# Patient Record
Sex: Female | Born: 1980 | Race: White | Hispanic: No | Marital: Married | Smoking: Never smoker
Health system: Southern US, Community
[De-identification: ages and names within clinical notes are randomized; demographics above are authoritative.]

## PROBLEM LIST (undated history)

## (undated) DIAGNOSIS — Z8489 Family history of other specified conditions: Secondary | ICD-10-CM

## (undated) DIAGNOSIS — J189 Pneumonia, unspecified organism: Secondary | ICD-10-CM

## (undated) DIAGNOSIS — F988 Other specified behavioral and emotional disorders with onset usually occurring in childhood and adolescence: Secondary | ICD-10-CM

## (undated) DIAGNOSIS — R51 Headache: Secondary | ICD-10-CM

## (undated) HISTORY — PX: WISDOM TOOTH EXTRACTION: SHX21

## (undated) HISTORY — PX: KNEE ARTHROSCOPY: SUR90

## (undated) HISTORY — PX: CERVICAL ABLATION: SHX5771

---
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
## 2011-01-25 ENCOUNTER — Other Ambulatory Visit (HOSPITAL_COMMUNITY)
Admission: RE | Admit: 2011-01-25 | Discharge: 2011-01-25 | Disposition: A | Discharge status: Home / Self Care | Payer: 59 | Source: Ambulatory Visit | Attending: Obstetrics & Gynecology | Admitting: Obstetrics & Gynecology

## 2011-01-25 DIAGNOSIS — Z124 Encounter for screening for malignant neoplasm of cervix: Secondary | ICD-10-CM | POA: Insufficient documentation

## 2011-01-25 DIAGNOSIS — R87810 Cervical high risk human papillomavirus (HPV) DNA test positive: Secondary | ICD-10-CM | POA: Insufficient documentation

## 2011-02-25 ENCOUNTER — Other Ambulatory Visit: Payer: Self-pay | Admitting: Obstetrics and Gynecology

## 2011-02-25 DIAGNOSIS — N63 Unspecified lump in unspecified breast: Secondary | ICD-10-CM

## 2011-03-01 ENCOUNTER — Inpatient Hospital Stay: Admission: RE | Admit: 2011-03-01 | Payer: 59 | Source: Ambulatory Visit

## 2011-03-03 ENCOUNTER — Ambulatory Visit
Admission: RE | Admit: 2011-03-03 | Discharge: 2011-03-03 | Disposition: A | Discharge status: Home / Self Care | Payer: 59 | Source: Ambulatory Visit | Attending: Obstetrics and Gynecology | Admitting: Obstetrics and Gynecology

## 2011-03-03 DIAGNOSIS — N63 Unspecified lump in unspecified breast: Secondary | ICD-10-CM

## 2011-08-16 ENCOUNTER — Other Ambulatory Visit (HOSPITAL_COMMUNITY)
Admission: RE | Admit: 2011-08-16 | Discharge: 2011-08-16 | Disposition: A | Discharge status: Home / Self Care | Payer: 59 | Source: Ambulatory Visit | Attending: Obstetrics and Gynecology | Admitting: Obstetrics and Gynecology

## 2011-08-16 DIAGNOSIS — Z124 Encounter for screening for malignant neoplasm of cervix: Secondary | ICD-10-CM | POA: Insufficient documentation

## 2011-12-20 ENCOUNTER — Other Ambulatory Visit (HOSPITAL_COMMUNITY)
Admission: RE | Admit: 2011-12-20 | Discharge: 2011-12-20 | Disposition: A | Discharge status: Home / Self Care | Payer: 59 | Source: Ambulatory Visit | Attending: Obstetrics and Gynecology | Admitting: Obstetrics and Gynecology

## 2011-12-20 DIAGNOSIS — Z1159 Encounter for screening for other viral diseases: Secondary | ICD-10-CM | POA: Insufficient documentation

## 2011-12-20 DIAGNOSIS — Z124 Encounter for screening for malignant neoplasm of cervix: Secondary | ICD-10-CM | POA: Insufficient documentation

## 2012-04-24 ENCOUNTER — Other Ambulatory Visit (HOSPITAL_COMMUNITY)
Admission: RE | Admit: 2012-04-24 | Discharge: 2012-04-24 | Disposition: A | Discharge status: Home / Self Care | Payer: 59 | Source: Ambulatory Visit | Attending: Obstetrics and Gynecology | Admitting: Obstetrics and Gynecology

## 2012-04-24 DIAGNOSIS — Z124 Encounter for screening for malignant neoplasm of cervix: Secondary | ICD-10-CM | POA: Insufficient documentation

## 2013-06-06 IMAGING — US US BREAST RIGHT
1 series · 2 of 2 positions shown · non-contrast
Comparison: None.

On physical exam, no mass is palpated in the upper half of the
right breast.

CLINICAL DATA: The patient's physician felt a lump at 12 o'clock
in the right breast.  A

RIGHT BREAST ULTRASOUND

[Series 1: us breast right · 2 of 2 slices shown]
[im 1/2]
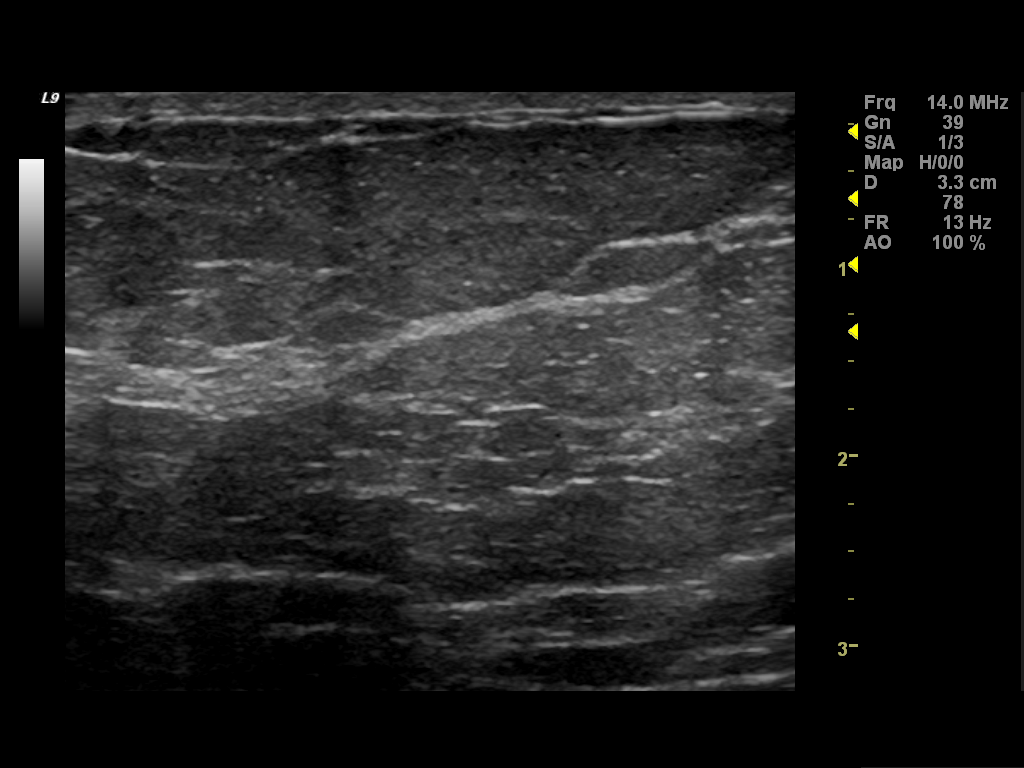
[im 2/2]
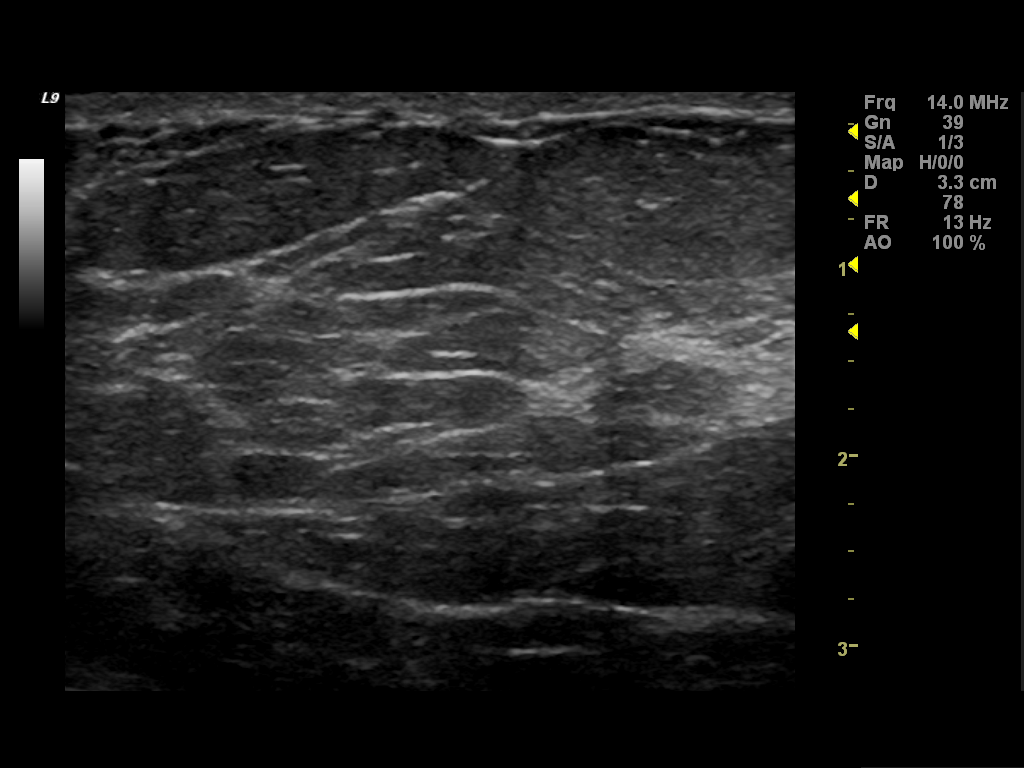

[2 of 2 positions shown; findings below may reference images not displayed]

FINDINGS: Ultrasound is performed, showing fatty tissue throughout
the upper half of the right breast with no mass, distortion or
shadowing to suggest malignancy.
IMPRESSION: No sonographic evidence of malignancy.  Yearly screening
mammography should begin at age 40 unless clinically indicated
earlier.

BI-RADS CATEGORY 1:  Negative.

## 2013-12-09 ENCOUNTER — Emergency Department (HOSPITAL_BASED_OUTPATIENT_CLINIC_OR_DEPARTMENT_OTHER)
Admission: EM | Admit: 2013-12-09 | Discharge: 2013-12-09 | Disposition: A | Discharge status: Home / Self Care | Payer: 59 | Attending: Emergency Medicine | Admitting: Emergency Medicine

## 2013-12-09 ENCOUNTER — Emergency Department (HOSPITAL_BASED_OUTPATIENT_CLINIC_OR_DEPARTMENT_OTHER): Payer: 59

## 2013-12-09 ENCOUNTER — Encounter (HOSPITAL_BASED_OUTPATIENT_CLINIC_OR_DEPARTMENT_OTHER): Payer: Self-pay | Admitting: Emergency Medicine

## 2013-12-09 DIAGNOSIS — Z79899 Other long term (current) drug therapy: Secondary | ICD-10-CM | POA: Insufficient documentation

## 2013-12-09 DIAGNOSIS — E663 Overweight: Secondary | ICD-10-CM | POA: Insufficient documentation

## 2013-12-09 DIAGNOSIS — K8020 Calculus of gallbladder without cholecystitis without obstruction: Secondary | ICD-10-CM

## 2013-12-09 DIAGNOSIS — Z3202 Encounter for pregnancy test, result negative: Secondary | ICD-10-CM | POA: Insufficient documentation

## 2013-12-09 LAB — CBC WITH DIFFERENTIAL/PLATELET
BASOS ABS: 0 10*3/uL (ref 0.0–0.1)
Basophils Relative: 0 % (ref 0–1)
Eosinophils Absolute: 0 10*3/uL (ref 0.0–0.7)
Eosinophils Relative: 0 % (ref 0–5)
HCT: 43.3 % (ref 36.0–46.0)
Hemoglobin: 14.6 g/dL (ref 12.0–15.0)
Lymphocytes Relative: 22 % (ref 12–46)
Lymphs Abs: 2 10*3/uL (ref 0.7–4.0)
MCH: 29.4 pg (ref 26.0–34.0)
MCHC: 33.7 g/dL (ref 30.0–36.0)
MCV: 87.3 fL (ref 78.0–100.0)
Monocytes Absolute: 0.6 10*3/uL (ref 0.1–1.0)
Monocytes Relative: 7 % (ref 3–12)
NEUTROS ABS: 6.5 10*3/uL (ref 1.7–7.7)
Neutrophils Relative %: 71 % (ref 43–77)
Platelets: 144 10*3/uL — ABNORMAL LOW (ref 150–400)
RBC: 4.96 MIL/uL (ref 3.87–5.11)
RDW: 12.1 % (ref 11.5–15.5)
WBC: 9.1 10*3/uL (ref 4.0–10.5)

## 2013-12-09 LAB — COMPREHENSIVE METABOLIC PANEL
ALT: 29 U/L (ref 0–35)
AST: 20 U/L (ref 0–37)
Albumin: 4.3 g/dL (ref 3.5–5.2)
Alkaline Phosphatase: 59 U/L (ref 39–117)
BILIRUBIN TOTAL: 1 mg/dL (ref 0.3–1.2)
BUN: 6 mg/dL (ref 6–23)
CHLORIDE: 104 meq/L (ref 96–112)
CO2: 23 mEq/L (ref 19–32)
Calcium: 9.7 mg/dL (ref 8.4–10.5)
Creatinine, Ser: 0.6 mg/dL (ref 0.50–1.10)
GFR calc Af Amer: >90 mL/min (ref >90)
Glucose, Bld: 92 mg/dL (ref 70–99)
POTASSIUM: 3.8 meq/L (ref 3.7–5.3)
Sodium: 141 mEq/L (ref 137–147)
Total Protein: 7.8 g/dL (ref 6.0–8.3)

## 2013-12-09 LAB — URINALYSIS, ROUTINE W REFLEX MICROSCOPIC
BILIRUBIN URINE: NEGATIVE
Glucose, UA: NEGATIVE mg/dL
HGB URINE DIPSTICK: NEGATIVE
Ketones, ur: NEGATIVE mg/dL
Leukocytes, UA: NEGATIVE
Nitrite: NEGATIVE
PH: 7.5 (ref 5.0–8.0)
Protein, ur: NEGATIVE mg/dL
SPECIFIC GRAVITY, URINE: 1.013 (ref 1.005–1.030)
Urobilinogen, UA: 0.2 mg/dL (ref 0.0–1.0)

## 2013-12-09 LAB — LIPASE, BLOOD: Lipase: 22 U/L (ref 11–59)

## 2013-12-09 LAB — PREGNANCY, URINE: Preg Test, Ur: NEGATIVE

## 2013-12-09 MED ORDER — SODIUM CHLORIDE 0.9 % IV SOLN
1000.0000 mL | INTRAVENOUS | Status: DC
  Administered 2013-12-09: 1000 mL via INTRAVENOUS

## 2013-12-09 MED ORDER — HYDROMORPHONE HCL PF 1 MG/ML IJ SOLN: 0.5000 mg | INTRAMUSCULAR | Status: DC | PRN

## 2013-12-09 MED ORDER — ONDANSETRON HCL 4 MG PO TABS: 4.0000 mg | ORAL_TABLET | Freq: Four times a day (QID) | ORAL | Status: DC

## 2013-12-09 MED ORDER — HYDROCODONE-ACETAMINOPHEN 5-325 MG PO TABS: 1.0000 | ORAL_TABLET | ORAL | Status: DC | PRN

## 2013-12-09 MED ORDER — SODIUM CHLORIDE 0.9 % IV SOLN
1000.0000 mL | Freq: Once | INTRAVENOUS | Status: AC
  Administered 2013-12-09: 1000 mL via INTRAVENOUS

## 2013-12-09 MED ORDER — ONDANSETRON HCL 4 MG/2ML IJ SOLN: 4.0000 mg | Freq: Once | INTRAMUSCULAR | Status: DC

## 2013-12-09 NOTE — ED Notes (Signed)
Patient transported to Ultrasound 

## 2013-12-09 NOTE — ED Notes (Signed)
Pt reports pain 1/10 and denies nausea.  Declines medication for now.

## 2013-12-09 NOTE — Discharge Instructions (Signed)
Biliary Colic  °Biliary colic is a steady or irregular pain in the upper abdomen. It is usually under the right side of the rib cage. It happens when gallstones interfere with the normal flow of bile from the gallbladder. Bile is a liquid that helps to digest fats. Bile is made in the liver and stored in the gallbladder. When you eat a meal, bile passes from the gallbladder through the cystic duct and the common bile duct into the small intestine. There, it mixes with partially digested food. If a gallstone blocks either of these ducts, the normal flow of bile is blocked. The muscle cells in the bile duct contract forcefully to try to move the stone. This causes the pain of biliary colic.  °SYMPTOMS  °· A person with biliary colic usually complains of pain in the upper abdomen. This pain can be: °· In the center of the upper abdomen just below the breastbone. °· In the upper-right part of the abdomen, near the gallbladder and liver. °· Spread back toward the right shoulder blade. °· Nausea and vomiting. °· The pain usually occurs after eating. °· Biliary colic is usually triggered by the digestive system's demand for bile. The demand for bile is high after fatty meals. Symptoms can also occur when a person who has been fasting suddenly eats a very large meal. Most episodes of biliary colic pass after 1 to 5 hours. After the most intense pain passes, your abdomen may continue to ache mildly for about 24 hours. °DIAGNOSIS  °After you describe your symptoms, your caregiver will perform a physical exam. He or she will pay attention to the upper right portion of your belly (abdomen). This is the area of your liver and gallbladder. An ultrasound will help your caregiver look for gallstones. Specialized scans of the gallbladder may also be done. Blood tests may be done, especially if you have fever or if your pain persists. °PREVENTION  °Biliary colic can be prevented by controlling the risk factors for gallstones. Some of  these risk factors, such as heredity, increasing age, and pregnancy are a normal part of life. Obesity and a high-fat diet are risk factors you can change through a healthy lifestyle. Women going through menopause who take hormone replacement therapy (estrogen) are also more likely to develop biliary colic. °TREATMENT  °· Pain medication may be prescribed. °· You may be encouraged to eat a fat-free diet. °· If the first episode of biliary colic is severe, or episodes of colic keep retuning, surgery to remove the gallbladder (cholecystectomy) is usually recommended. This procedure can be done through small incisions using an instrument called a laparoscope. The procedure often requires a brief stay in the hospital. Some people can leave the hospital the same day. It is the most widely used treatment in people troubled by painful gallstones. It is effective and safe, with no complications in more than 90% of cases. °· If surgery cannot be done, medication that dissolves gallstones may be used. This medication is expensive and can take months or years to work. Only small stones will dissolve. °· Rarely, medication to dissolve gallstones is combined with a procedure called shock-wave lithotripsy. This procedure uses carefully aimed shock waves to break up gallstones. In many people treated with this procedure, gallstones form again within a few years. °PROGNOSIS  °If gallstones block your cystic duct or common bile duct, you are at risk for repeated episodes of biliary colic. There is also a 25% chance that you will develop   a gallbladder infection(acute cholecystitis), or some other complication of gallstones within 10 to 20 years. If you have surgery, schedule it at a time that is convenient for you and at a time when you are not sick. °HOME CARE INSTRUCTIONS  °· Drink plenty of clear fluids. °· Avoid fatty, greasy or fried foods, or any foods that make your pain worse. °· Take medications as directed. °SEEK MEDICAL  CARE IF:  °· You develop a fever over 100.5° F (38.1° C). °· Your pain gets worse over time. °· You develop nausea that prevents you from eating and drinking. °· You develop vomiting. °SEEK IMMEDIATE MEDICAL CARE IF:  °· You have continuous or severe belly (abdominal) pain which is not relieved with medications. °· You develop nausea and vomiting which is not relieved with medications. °· You have symptoms of biliary colic and you suddenly develop a fever and shaking chills. This may signal cholecystitis. Call your caregiver immediately. °· You develop a yellow color to your skin or the white part of your eyes (jaundice). °Document Released: 10/31/2005 Document Revised: 08/22/2011 Document Reviewed: 01/10/2008 °ExitCare® Patient Information ©2015 [employer]. This information is not intended to replace advice given to you by your health care provider. Make sure you discuss any questions you have with your health care provider. ° °Cholelithiasis °Cholelithiasis (also called gallstones) is a form of gallbladder disease in which gallstones form in your gallbladder. The gallbladder is an organ that stores bile made in the liver, which helps digest fats. Gallstones begin as small crystals and slowly grow into stones. Gallstone pain occurs when the gallbladder spasms and a gallstone is blocking the duct. Pain can also occur when a stone passes out of the duct.  °RISK FACTORS °· Being female.   °· Having multiple pregnancies. Health care providers sometimes advise removing diseased gallbladders before future pregnancies.   °· Being obese. °· Eating a diet heavy in fried foods and fat.   °· Being older than 60 years and increasing age.   °· Prolonged use of medicines containing female hormones.   °· Having diabetes mellitus.   °· Rapidly losing weight.   °· Having a family history of gallstones (heredity).   °SYMPTOMS °· Nausea.   °· Vomiting. °· Abdominal pain.   °· Yellowing of the skin (jaundice).   °· Sudden pain. It  may persist from several minutes to several hours. °· Fever.   °· Tenderness to the touch.  °In some cases, when gallstones do not move into the bile duct, people have no pain or symptoms. These are called "silent" gallstones.  °TREATMENT °Silent gallstones do not need treatment. In severe cases, emergency surgery may be required. Options for treatment include: °· Surgery to remove the gallbladder. This is the most common treatment. °· Medicines. These do not always work and may take 6-12 months or more to work. °· Shock wave treatment (extracorporeal biliary lithotripsy). In this treatment an ultrasound machine sends shock waves to the gallbladder to break gallstones into smaller pieces that can pass into the intestines or be dissolved by medicine. °HOME CARE INSTRUCTIONS  °· Only take over-the-counter or prescription medicines for pain, discomfort, or fever as directed by your health care provider.   °· Follow a low-fat diet until seen again by your health care provider. Fat causes the gallbladder to contract, which can result in pain.   °· Follow up with your health care provider as directed. Attacks are almost always recurrent and surgery is usually required for permanent treatment.   °SEEK IMMEDIATE MEDICAL CARE IF:  °· Your pain increases and is   not controlled by medicines.   °· You have a fever or persistent symptoms for more than 2-3 days.   °· You have a fever and your symptoms suddenly get worse.   °· You have persistent nausea and vomiting.   °MAKE SURE YOU:  °· Understand these instructions. °· Will watch your condition. °· Will get help right away if you are not doing well or get worse. °Document Released: 05/26/2005 Document Revised: 01/30/2013 Document Reviewed: 11/21/2012 °ExitCare® Patient Information ©2015 [employer]. This information is not intended to replace advice given to you by your health care provider. Make sure you discuss any questions you have with your health care provider. ° °

## 2013-12-09 NOTE — ED Notes (Signed)
Pt c/o epigastric pain x 3 days denies n/v/d

## 2013-12-09 NOTE — ED Notes (Signed)
MD at bedside. 

## 2013-12-09 NOTE — ED Provider Notes (Signed)
CSN: [id number]     Arrival date & time 12/09/13  1546 History  This chart was scribed for Linwood DibblesJon Knapp, MD by Shari HeritageAisha Amuda, ED Scribe. The patient was seen in room MH04/MH04. Patient's care was started at 4:06 PM.   Chief Complaint  Patient presents with  . Abdominal Pain    The history is provided by the patient. No language interpreter was used.    HPI Comments: Jennifer Bolton is a 33 y.o. female who presents to the Emergency Department complaining of intermittent, moderate to severe, epigastric abdominal pain that began 3 days ago. She states that on Saturday when symptoms began, she had three episodes of discomfort where pain went away completely before returning again. Patient says that prior to pain onset she ate taquitos for dinner. She states that yesterday she had moderate abdominal soreness, and today, she starting having gradually worsening, dull, aching pain in her epigastric region prompting her to come to the ED. Patient states that her mother has a history of gallbladder disease and is concerned about this. She says that she has not tried to eat very much since pain onset, but is able to tolerate fluids. She denies vomiting, diarrhea, nausea, shortness of breath, chest pain or other symptoms at this time.  She has no chronic medical conditions.    History reviewed. No pertinent past medical history. Past Surgical History  Procedure Laterality Date  . Knee arthroscopy     History reviewed. No pertinent family history. History  Substance Use Topics  . Smoking status: Never Smoker   . Smokeless tobacco: Not on file  . Alcohol Use: No   OB History   Grav Para Term Preterm Abortions TAB SAB Ect Mult Living                 Review of Systems  Constitutional: Negative for fever.  Respiratory: Negative for shortness of breath.   Cardiovascular: Negative for chest pain.  Gastrointestinal: Positive for abdominal pain. Negative for nausea, vomiting and diarrhea.  All other systems  reviewed and are negative.   Allergies  Review of patient's allergies indicates no known allergies.  Home Medications   Prior to Admission medications   Medication Sig Start Date End Date Taking? Authorizing Shantanique Hodo  amphetamine-dextroamphetamine (ADDERALL) 30 MG tablet Take 30 mg by mouth 2 (two) times daily.   Yes Historical Carlise Stofer, MD  zolpidem (AMBIEN) 5 MG tablet Take 5 mg by mouth at bedtime as needed for sleep.   Yes Historical Kaidon Kinker, MD   Triage Vitals: BP 120/97  Pulse 120  Temp(Src) 98.8 F (37.1 C) (Oral)  Resp 16  Ht 5\' 6"  (1.676 m)  Wt 246 lb (111.585 kg)  BMI 39.72 kg/m2  SpO2 98% Physical Exam  Nursing note and vitals reviewed. Constitutional: She appears well-developed and well-nourished. No distress.  Overweight.  HENT:  Head: Normocephalic and atraumatic.  Right Ear: External ear normal.  Left Ear: External ear normal.  Eyes: Conjunctivae are normal. Right eye exhibits no discharge. Left eye exhibits no discharge. No scleral icterus.  Neck: Neck supple. No tracheal deviation present.  Cardiovascular: Normal rate, regular rhythm and intact distal pulses.   Pulmonary/Chest: Effort normal and breath sounds normal. No stridor. No respiratory distress. She has no wheezes. She has no rales.  Abdominal: Soft. Bowel sounds are normal. She exhibits no distension and no mass. There is tenderness in the epigastric area. There is no rebound, no guarding and negative Murphy's sign. No hernia.  Musculoskeletal: She exhibits  no edema and no tenderness.  Neurological: She is alert. She has normal strength. No cranial nerve deficit (no facial droop, extraocular movements intact, no slurred speech) or sensory deficit. She exhibits normal muscle tone. She displays no seizure activity. Coordination normal.  Skin: Skin is warm and dry. No rash noted.  Psychiatric: She has a normal mood and affect.    ED Course  Procedures (including critical care time) DIAGNOSTIC  STUDIES: Oxygen Saturation is 98% on room air, normal by my interpretation.    COORDINATION OF CARE: 4:09 PM- will give IV fluids, Zofran, and Dilaudid, and order labs, and US of abdomen. Patient informed of current plan for treatment and evaluation and agrees with plan at this time.  Labs Review Labs Reviewed  CBC WITH DIFFERENTIAL - Abnormal; Notable for the following:    Platelets 144 (*)    All other components within normal limits  URINALYSIS, ROUTINE W REFLEX MICROSCOPIC  PREGNANCY, URINE  COMPREHENSIVE METABOLIC PANEL  LIPASE, BLOOD    Imaging Review [place] Abdomen Complete  12/09/2013   CLINICAL DATA:  Two-day history of epigastric pain  EXAM: ULTRASOUND ABDOMEN COMPLETE  COMPARISON:  None.  FINDINGS: Gallbladder:  The gallbladder exhibits a wall echo shadow contour consistent with multiple shadowing stones. The gallbladder wall is measured at 3 mm. There is no positive sonographic Murphy's sign.  Common bile duct:  Diameter: 4 mm  Liver:  No focal lesion identified. Within normal limits in parenchymal echogenicity; bowel gas limits evaluation.  IVC:  No abnormality visualized; bowel gas limits evaluation.  Pancreas:  Visualized portion unremarkable; bowel gas limits evaluation.  Spleen:  Size and appearance within normal limits.  Right Kidney:  Length: 12.2 cm. Echogenicity within normal limits. No mass or hydronephrosis visualized.  Left Kidney:  Length: 13.1 cm. Echogenicity within normal limits. No mass or hydronephrosis visualized.  Abdominal aorta:  No aneurysm visualized; evaluation limited by bowel gas.  Other findings:  No ascites is demonstrated.  IMPRESSION: 1. There are multiple gallstones without sonographic evidence of acute cholecystitis. 2. The remainder of the visualized abdominal visceral are within the limits of normal.   Electronically Signed   By: David  [place]azil[place]   On: 12/09/2013 16:57     MDM   Final diagnoses:  Gallstones    Pt's exam and history is consistent  with biliary colic.  The ultrasound confirms gallstones. Patient's not currently having any significant pain. She did not want any medications for pain in the emergency department.  I will discharge her home with a referral to general surgery. We discussed dietary modification. Warning signs and precautions were discussed.  I personally performed the services described in this documentation, which was scribed in my presence.  The recorded information has been reviewed and is accurate.    Linwood DibblesJon Knapp, MD 12/09/13 1739

## 2013-12-26 ENCOUNTER — Encounter (INDEPENDENT_AMBULATORY_CARE_PROVIDER_SITE_OTHER): Payer: Self-pay | Admitting: General Surgery

## 2013-12-26 ENCOUNTER — Ambulatory Visit (INDEPENDENT_AMBULATORY_CARE_PROVIDER_SITE_OTHER): Payer: 59 | Admitting: General Surgery

## 2013-12-26 ENCOUNTER — Telehealth (INDEPENDENT_AMBULATORY_CARE_PROVIDER_SITE_OTHER): Payer: Self-pay

## 2013-12-26 VITALS — BP 122/90 | HR 88 | Temp 98.8°F | Resp 16 | Ht 66.0 in | Wt 246.0 lb

## 2013-12-26 DIAGNOSIS — K8020 Calculus of gallbladder without cholecystitis without obstruction: Secondary | ICD-10-CM | POA: Insufficient documentation

## 2013-12-26 MED ORDER — HYDROCODONE-ACETAMINOPHEN 5-325 MG PO TABS: 1.0000 | ORAL_TABLET | ORAL | Status: DC | PRN

## 2013-12-26 NOTE — Progress Notes (Signed)
Patient ID: Jennifer Bolton, female   DOB: 1980-06-15, 33 y.o.   MRN: [id number]  Chief Complaint  Patient presents with  . Cholelithiasis    new pt- eval gallstones    HPI Jennifer Bolton is a 33 y.o. female.   HPI 33 yo WF referred by Dr Linwood DibblesJon Knapp for evaluation of gallbladder disease. The patient states that she had acute onset of epigastric pain a few weeks ago that came in waves. It went away but returned a short time later. The next day she didn't eat much because of concerns of pain. The following week if she ate anything it would cause epigastric pain. For the past 3 days she's been able to use without any discomfort. She denies any nausea, vomiting, diarrhea or constipation. She denies any melena or hematochezia. She denies any acholic stools. She does not take NSAIDs on a frequent occasion. However she did try some Aleve during one of the intense episodes which did help a little bit.  She states that she might have had one episode of epigastric pain a few years ago but it wasn't nearly as intense. Her mother has had gallbladder problems. Her initial pain  after eating spicy [place] food. She went to the emergency room on June 29 and underwent labs as well as an ultrasound. Her labs were normal and her ultrasound showed multiple gallstones.  She does relate a history of recurrent episodes of strep throat and sore throat and states that she's been told in the past that she needs to have her tonsils out. She is wondering if it's potential to have both procedures done at the same time if she is determined to need a tonsillectomy  Of note she says that her maternal grandmother passed away and die from complications during surgery in 2001. She states it's unclear whether or not it was reaction to anesthesia or her underlying medical issues. The patient herself has had general anesthesia during knee surgery without any complications.  History reviewed. No pertinent past medical history.  Past  Surgical History  Procedure Laterality Date  . Knee arthroscopy    . Wisdom tooth extraction    . Cervical ablation      History reviewed. No pertinent family history.  Social History History  Substance Use Topics  . Smoking status: Never Smoker   . Smokeless tobacco: Not on file  . Alcohol Use: No    No Known Allergies  Current Outpatient Prescriptions  Medication Sig Dispense Refill  . amphetamine-dextroamphetamine (ADDERALL) 30 MG tablet Take 30 mg by mouth 2 (two) times daily.      Marland Kitchen. HYDROcodone-acetaminophen (NORCO/VICODIN) 5-325 MG per tablet Take 1-2 tablets by mouth every 4 (four) hours as needed.  16 tablet  0  . levonorgestrel (MIRENA) 20 MCG/24HR IUD 1 each by Intrauterine route once.      Marland Kitchen. zolpidem (AMBIEN) 5 MG tablet Take 5 mg by mouth at bedtime as needed for sleep.       No current facility-administered medications for this visit.    Review of Systems Review of Systems  Constitutional: Negative for fever, activity change, appetite change and unexpected weight change.  HENT: Negative for nosebleeds and trouble swallowing.   Eyes: Negative for photophobia and visual disturbance.  Respiratory: Negative for chest tightness and shortness of breath.   Cardiovascular: Negative for chest pain and leg swelling.       Denies CP, SOB, orthopnea, PND, DOE  Gastrointestinal: Positive for abdominal pain. Negative for nausea, vomiting, diarrhea,  blood in stool and abdominal distention.  Genitourinary: Negative for dysuria and difficulty urinating.  Musculoskeletal: Negative for arthralgias.  Skin: Negative for pallor and rash.  Neurological: Negative for dizziness, seizures, facial asymmetry and numbness.          Hematological: Negative for adenopathy. Does not bruise/bleed easily.  Psychiatric/Behavioral: Negative for behavioral problems and agitation.    Blood pressure 122/90, pulse 88, temperature 98.8 F (37.1 C), temperature source Oral, resp. rate 16, height  5\' 6"  (1.676 m), weight 246 lb (111.585 kg).  Physical Exam Physical Exam  Vitals reviewed. Constitutional: She is oriented to person, place, and time. She appears well-developed and well-nourished. No distress.  obese  HENT:  Head: Normocephalic and atraumatic.  Right Ear: External ear normal.  Left Ear: External ear normal.  Eyes: Conjunctivae are normal. No scleral icterus.  Neck: Normal range of motion. Neck supple. No tracheal deviation present. No thyromegaly present.  Cardiovascular: Normal rate, normal heart sounds and intact distal pulses.   Pulmonary/Chest: Effort normal and breath sounds normal. No respiratory distress. She has no wheezes.  Abdominal: Soft. She exhibits no distension. There is no tenderness. There is no rebound and no guarding.  Musculoskeletal: Normal range of motion. She exhibits no edema and no tenderness.  Lymphadenopathy:    She has no cervical adenopathy.  Neurological: She is alert and oriented to person, place, and time. She exhibits normal muscle tone.  Skin: Skin is warm and dry. No rash noted. She is not diaphoretic. No erythema. No pallor.  Psychiatric: She has a normal mood and affect. Her behavior is normal. Judgment and thought content normal.    Data Reviewed ED note with labs  abd [place]  Assessment    Symptomatic cholelithiasis H/o Recurrent  Strep throat    Plan    I believe the patient's symptoms are consistent with gallbladder disease.  We discussed gallbladder disease. The patient was given Agricultural engineer. We discussed non-operative and operative management. We discussed the signs & symptoms of acute cholecystitis  I discussed laparoscopic cholecystectomy with IOC in detail.  The patient was given educational material as well as diagrams detailing the procedure.  We discussed the risks and benefits of a laparoscopic cholecystectomy including, but not limited to bleeding, infection, injury to surrounding structures such as the  intestine or liver, bile leak, retained gallstones, need to convert to an open procedure, prolonged diarrhea, blood clots such as  DVT, common bile duct injury, anesthesia risks, and possible need for additional procedures.  We discussed the typical post-operative recovery course. I explained that the likelihood of improvement of their symptoms is good.   With respect to whether or not she can have a concomitant tonsillectomy, I first explained to her that she needed to be evaluated by an ear nose and throat surgeon to determine whether or not she may  warrant surgery. If she is felt to have the need for tonsillectomy, I explained that I could not guarantee that the ENT surgeon would recommend doing it at the same time  So right now we're going to refer her to [facility] to ENT for evaluation. We are not going to schedule her for surgery and to hear the results of that consultation  She was given access to watch EMMI video on laparoscopic cholecystectomy  Mary Sella. Andrey Campanile, MD, FACS General, Bariatric, & Minimally Invasive Surgery [facility], [place]        [facility] M 12/26/2013, 3:15 PM

## 2013-12-26 NOTE — Patient Instructions (Signed)
[facility] to discuss to evaluate you for potential tonsil surgery If they do not believe you need your tonsils out, please contact our office so we can schedule your gallbladder surgery  Cholelithiasis Cholelithiasis (also called gallstones) is a form of gallbladder disease in which gallstones form in your gallbladder. The gallbladder is an organ that stores bile made in the liver, which helps digest fats. Gallstones begin as small crystals and slowly grow into stones. Gallstone pain occurs when the gallbladder spasms and a gallstone is blocking the duct. Pain can also occur when a stone passes out of the duct.  RISK FACTORS  Being female.   Having multiple pregnancies. Health care providers sometimes advise removing diseased gallbladders before future pregnancies.   Being obese.  Eating a diet heavy in fried foods and fat.   Being older than 60 years and increasing age.   Prolonged use of medicines containing female hormones.   Having diabetes mellitus.   Rapidly losing weight.   Having a family history of gallstones (heredity).  SYMPTOMS  Nausea.   Vomiting.  Abdominal pain.   Yellowing of the skin (jaundice).   Sudden pain. It may persist from several minutes to several hours.  Fever.   Tenderness to the touch. In some cases, when gallstones do not move into the bile duct, people have no pain or symptoms. These are called "silent" gallstones.  TREATMENT Silent gallstones do not need treatment. In severe cases, emergency surgery may be required. Options for treatment include:  Surgery to remove the gallbladder. This is the most common treatment.  Medicines. These do not always work and may take 6-12 months or more to work.  Shock wave treatment (extracorporeal biliary lithotripsy). In this treatment an ultrasound machine sends shock waves to the gallbladder to break gallstones into smaller pieces that can pass into the intestines  or be dissolved by medicine. HOME CARE INSTRUCTIONS   Only take over-the-counter or prescription medicines for pain, discomfort, or fever as directed by your health care provider.   Follow a low-fat diet until seen again by your health care provider. Fat causes the gallbladder to contract, which can result in pain.   Follow up with your health care provider as directed. Attacks are almost always recurrent and surgery is usually required for permanent treatment.  SEEK IMMEDIATE MEDICAL CARE IF:   Your pain increases and is not controlled by medicines.   You have a fever or persistent symptoms for more than 2-3 days.   You have a fever and your symptoms suddenly get worse.   You have persistent nausea and vomiting.  MAKE SURE YOU:   Understand these instructions.  Will watch your condition.  Will get help right away if you are not doing well or get worse. Document Released: 05/26/2005 Document Revised: 01/30/2013 Document Reviewed: 11/21/2012 [facility] Patient Information 2015 [place]. This information is not intended to replace advice given to you by your health care provider. Make sure you discuss any questions you have with your health care provider.   Laparoscopic Cholecystectomy Laparoscopic cholecystectomy is surgery to remove the gallbladder. The gallbladder is located in the upper right part of the abdomen, behind the liver. It is a storage sac for bile produced in the liver. Bile aids in the digestion and absorption of fats. Cholecystectomy is often done for inflammation of the gallbladder (cholecystitis). This condition is usually caused by a buildup of gallstones (cholelithiasis) in your gallbladder. Gallstones can block the flow  of bile, resulting in inflammation and pain. In severe cases, emergency surgery may be required. When emergency surgery is not required, you will have time to prepare for the procedure. Laparoscopic surgery is an alternative to open  surgery. Laparoscopic surgery has a shorter recovery time. Your common bile duct may also need to be examined during the procedure. If stones are found in the common bile duct, they may be removed. LET [facility] CARE PROVIDER KNOW ABOUT:  Any allergies you have.  All medicines you are taking, including vitamins, herbs, eye drops, creams, and over-the-counter medicines.  Previous problems you or members of your family have had with the use of anesthetics.  Any blood disorders you have.  Previous surgeries you have had.  Medical conditions you have. RISKS AND COMPLICATIONS Generally, this is a safe procedure. However, as with any procedure, complications can occur. Possible complications include:  Infection.  Damage to the common bile duct, nerves, arteries, veins, or other internal organs such as the stomach, liver, or intestines.  Bleeding.  A stone may remain in the common bile duct.  A bile leak from the cyst duct that is clipped when your gallbladder is removed.  The need to convert to open surgery, which requires a larger incision in the abdomen. This may be necessary if your surgeon thinks it is not safe to continue with a laparoscopic procedure. BEFORE THE PROCEDURE  Ask your health care provider about changing or stopping any regular medicines. You will need to stop taking aspirin or blood thinners at least 5 days prior to surgery.  Do not eat or drink anything after midnight the night before surgery.  Let your health care provider know if you develop a cold or other infectious problem before surgery. PROCEDURE   You will be given medicine to make you sleep through the procedure (general anesthetic). A breathing tube will be placed in your mouth.  When you are asleep, your surgeon will make several small cuts (incisions) in your abdomen.  A thin, lighted tube with a tiny camera on the end (laparoscope) is inserted through one of the small incisions. The camera on the  laparoscope sends a picture to a TV screen in the operating room. This gives the surgeon a good view inside your abdomen.  A gas will be pumped into your abdomen. This expands your abdomen so that the surgeon has more room to perform the surgery.  Other tools needed for the procedure are inserted through the other incisions. The gallbladder is removed through one of the incisions.  After the removal of your gallbladder, the incisions will be closed with stitches, staples, or skin glue. AFTER THE PROCEDURE  You will be taken to a recovery area where your progress will be checked often.  You may be allowed to go home the same day if your pain is controlled and you can tolerate liquids. Document Released: 05/30/2005 Document Revised: 03/20/2013 Document Reviewed: 01/09/2013 [facility] Patient Information 2015 [place], [place]. This information is not intended to replace advice given to you by your health care provider. Make sure you discuss any questions you have with your health care provider.

## 2013-12-26 NOTE — Addendum Note (Signed)
Addended byIgnacia Marvel: MOFFITT, KENDALL on: 12/26/2013 05:04 PM   Modules accepted: Orders

## 2013-12-26 NOTE — Telephone Encounter (Signed)
Spoke with pt and informed her that Dr. Gardiner BarefootWilson okayed the refill request and that I will be mailing the Rx to her address as requested by the patient.

## 2013-12-26 NOTE — Telephone Encounter (Signed)
Pt was seen in the clinic today. When she was here she forgot to ask Dr Andrey CampanileWilson for a refill on her Norco 5/325mg . Pt was given a rx when she went to the ED. Pt states that when she has a GB attack that her pain is a 6-7. Pt states that she does not have to take the medication all the time. Informed pt that I would send Dr Andrey CampanileWilson a message and that we would contact her as soon as we receive a response. Pt verbalized understanding and agrees with POC.

## 2014-01-02 ENCOUNTER — Other Ambulatory Visit (INDEPENDENT_AMBULATORY_CARE_PROVIDER_SITE_OTHER): Payer: Self-pay | Admitting: General Surgery

## 2014-01-03 ENCOUNTER — Other Ambulatory Visit (HOSPITAL_COMMUNITY): Payer: Self-pay | Admitting: Otolaryngology

## 2014-01-31 ENCOUNTER — Encounter (HOSPITAL_COMMUNITY): Payer: Self-pay | Admitting: Pharmacy Technician

## 2014-02-03 ENCOUNTER — Other Ambulatory Visit (HOSPITAL_COMMUNITY): Payer: Self-pay | Admitting: *Deleted

## 2014-02-03 NOTE — Pre-Procedure Instructions (Signed)
Jennifer Bolton  02/03/2014   Your procedure is scheduled on:  Friday, February 14, 2014 at 7:30 AM.   Report to [facility] Entrance "A" Admitting Office at 5:30 AM.   Call this number if you have problems the morning of surgery: [phone]   Remember:   Do not eat food or drink liquids after midnight Thursday, 02/13/14.   Take these medicines the morning of surgery with A SIP OF WATER: HYDROcodone-acetaminophen (NORCO/VICODIN) - if needed    Do not wear jewelry, make-up or nail polish.  Do not wear lotions, powders, or perfumes. You may wear deodorant.  Do not shave 48 hours prior to surgery.   Do not bring valuables to the hospital.  [facility] is not responsible                  for any belongings or valuables.               Contacts, dentures or bridgework may not be worn into surgery.  Leave suitcase in the car. After surgery it may be brought to your room.  For patients admitted to the hospital, discharge time is determined by your                treatment team.       Special Instructions: [facility] - Preparing for Surgery  Before surgery, you can play an important role.  Because skin is not sterile, your skin needs to be as free of germs as possible.  You can reduce the number of germs on you skin by washing with CHG (chlorahexidine gluconate) soap before surgery.  CHG is an antiseptic cleaner which kills germs and bonds with the skin to continue killing germs even after washing.  Please DO NOT use if you have an allergy to CHG or antibacterial soaps.  If your skin becomes reddened/irritated stop using the CHG and inform your nurse when you arrive at Short Stay.  Do not shave (including legs and underarms) for at least 48 hours prior to the first CHG shower.  You may shave your face.  Please follow these instructions carefully:   1.  Shower with CHG Soap the night before surgery and the                                morning of Surgery.  2.  If you choose to wash  your hair, wash your hair first as usual with your       normal shampoo.  3.  After you shampoo, rinse your hair and body thoroughly to remove the                      Shampoo.  4.  Use CHG as you would any other liquid soap.  You can apply chg directly       to the skin and wash gently with scrungie or a clean washcloth.  5.  Apply the CHG Soap to your body ONLY FROM THE NECK DOWN.        Do not use on open wounds or open sores.  Avoid contact with your eyes, ears, mouth and genitals (private parts).  Wash genitals (private parts) with your normal soap.  6.  Wash thoroughly, paying special attention to the area where your surgery        will be performed.  7.  Thoroughly rinse your body with warm  water from the neck down.  8.  DO NOT shower/wash with your normal soap after using and rinsing off       the CHG Soap.  9.  Pat yourself dry with a clean towel.            10.  Wear clean pajamas.            11.  Place clean sheets on your bed the night of your first shower and do not        sleep with pets.  Day of Surgery  Do not apply any lotions the morning of surgery.  Please wear clean clothes to the hospital/surgery center.     Please read over the following fact sheets that you were given: Pain Booklet, Coughing and Deep Breathing and Surgical Site Infection Prevention

## 2014-02-04 ENCOUNTER — Encounter (HOSPITAL_COMMUNITY)
Admission: RE | Admit: 2014-02-04 | Discharge: 2014-02-04 | Disposition: A | Discharge status: Home / Self Care | Payer: 59 | Source: Ambulatory Visit | Attending: General Surgery | Admitting: General Surgery

## 2014-02-04 ENCOUNTER — Encounter (HOSPITAL_COMMUNITY): Payer: Self-pay

## 2014-02-04 DIAGNOSIS — Z01818 Encounter for other preprocedural examination: Secondary | ICD-10-CM | POA: Insufficient documentation

## 2014-02-04 DIAGNOSIS — K8020 Calculus of gallbladder without cholecystitis without obstruction: Secondary | ICD-10-CM | POA: Insufficient documentation

## 2014-02-04 HISTORY — DX: Family history of other specified conditions: Z84.89

## 2014-02-04 HISTORY — DX: Headache: R51

## 2014-02-04 HISTORY — DX: Other specified behavioral and emotional disorders with onset usually occurring in childhood and adolescence: F98.8

## 2014-02-04 HISTORY — DX: Pneumonia, unspecified organism: J18.9

## 2014-02-04 LAB — CBC
HCT: 42.5 % (ref 36.0–46.0)
Hemoglobin: 14.5 g/dL (ref 12.0–15.0)
MCH: 30 pg (ref 26.0–34.0)
MCHC: 34.1 g/dL (ref 30.0–36.0)
MCV: 88 fL (ref 78.0–100.0)
PLATELETS: 123 10*3/uL — AB (ref 150–400)
RBC: 4.83 MIL/uL (ref 3.87–5.11)
RDW: 12.2 % (ref 11.5–15.5)
WBC: 7.4 10*3/uL (ref 4.0–10.5)

## 2014-02-04 LAB — HCG, SERUM, QUALITATIVE: Preg, Serum: NEGATIVE

## 2014-02-13 MED ORDER — DEXTROSE 5 % IV SOLN
2.0000 g | INTRAVENOUS | Status: AC
  Administered 2014-02-14: 2 g via INTRAVENOUS
  Filled 2014-02-13 (×2): qty 2

## 2014-02-13 NOTE — H&P (Addendum)
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  She now presents for tonsillectomy. Dr. Andrey Campanile is also performing laparoscopic cholecystectomy. Dr. Emeline Darling, [place] has discussed the risks (bleeding, infection, anesthesia risks, etc.) , benefits, and alternatives of this procedure. The patient understands the risks and would like to proceed with the procedure. The chances of success of the procedure are >50% and the patient understands this. I personally performed an examination of the patient within 24 hours of the procedure.  PAST MEDICAL HISTORY: Past Medical History  Diagnosis Date  . Family history of anesthesia complication     maternal grandmother did not wake up after anesthesia due to her heart problems  . Pneumonia     years ago  . ADD (attention deficit disorder)     on meds  . Headache(784.0)      PAST SURGICAL HISTORY: Past Surgical History  Procedure Laterality Date  . Knee arthroscopy    . Wisdom tooth extraction    . Cervical ablation      MEDICATIONS: No current facility-administered medications on file prior to encounter.   Current Outpatient Prescriptions on File Prior to Encounter  Medication Sig Dispense Refill  . amphetamine-dextroamphetamine (ADDERALL) 30 MG tablet Take 30 mg by mouth 2 (two) times daily.      Marland Kitchen levonorgestrel (MIRENA) 20 MCG/24HR IUD 1 each by Intrauterine route once.      Marland Kitchen zolpidem (AMBIEN) 5 MG tablet Take 5 mg by mouth at bedtime as needed for sleep.        ALLERGIES: No Known Allergies    SOCIAL HISTORY: History   Social History  . Marital Status: Married    Spouse Name: N/A    Number of Children: N/A  . Years of Education: N/A   Occupational History  . Not on file.   Social History Main Topics  .  Smoking status: Never Smoker   . Smokeless tobacco: Never Used  . Alcohol Use: Yes     Comment: social  . Drug Use: No  . Sexual Activity: Yes    Birth Control/ Protection: IUD   Other Topics Concern  . Not on file   Social History Narrative  . No narrative on file    FAMILY HISTORY: No family history on file.  REVIEW OF SYSTEMS:  HEENT: occasional sore throat, headache, otherwise negative x 12 systems except per the HPI  PHYSICAL EXAM:  GENERAL:  NAD VITAL SIGNS:   Filed Vitals:   02/14/14 0611  BP: 102/76  Pulse: 86  Temp: 98.7 F (37.1 C)  Resp: 18   SKIN:  Warm, dry HEENT:  Freidman 3+ tonsils NECK:  supple   ABDOMEN:  soft MUSCULOSKELETAL: normal strength PSYCH:  Normal affect NEUROLOGIC:  CN 2-12 intact and symmetric   ASSESSMENT AND PLAN: Plan to proceed with outpatient tonsillectomy. Patient understands the risks, benefits, and alternatives. Informed written consent signed, witnessed, and on chart. 02/15/24   Jennifer Bolton

## 2014-02-14 ENCOUNTER — Encounter (HOSPITAL_COMMUNITY): Payer: 59 | Admitting: Certified Registered Nurse Anesthetist

## 2014-02-14 ENCOUNTER — Ambulatory Visit (HOSPITAL_COMMUNITY): Payer: 59 | Admitting: Certified Registered Nurse Anesthetist

## 2014-02-14 ENCOUNTER — Observation Stay (HOSPITAL_COMMUNITY)
Admission: RE | Admit: 2014-02-14 | Discharge: 2014-02-15 | Disposition: A | Discharge status: Home / Self Care | Payer: 59 | Source: Ambulatory Visit | Attending: General Surgery | Admitting: General Surgery

## 2014-02-14 ENCOUNTER — Encounter (HOSPITAL_COMMUNITY): Payer: Self-pay | Admitting: Surgery

## 2014-02-14 ENCOUNTER — Ambulatory Visit (HOSPITAL_COMMUNITY): Payer: 59

## 2014-02-14 ENCOUNTER — Encounter (HOSPITAL_COMMUNITY)
Admission: RE | Disposition: A | Discharge status: Home / Self Care | Payer: Self-pay | Source: Ambulatory Visit | Attending: General Surgery

## 2014-02-14 DIAGNOSIS — K8010 Calculus of gallbladder with chronic cholecystitis without obstruction: Principal | ICD-10-CM | POA: Insufficient documentation

## 2014-02-14 DIAGNOSIS — J351 Hypertrophy of tonsils: Secondary | ICD-10-CM | POA: Diagnosis not present

## 2014-02-14 DIAGNOSIS — Z9049 Acquired absence of other specified parts of digestive tract: Secondary | ICD-10-CM

## 2014-02-14 DIAGNOSIS — F988 Other specified behavioral and emotional disorders with onset usually occurring in childhood and adolescence: Secondary | ICD-10-CM | POA: Insufficient documentation

## 2014-02-14 DIAGNOSIS — Z79899 Other long term (current) drug therapy: Secondary | ICD-10-CM | POA: Diagnosis not present

## 2014-02-14 DIAGNOSIS — Z975 Presence of (intrauterine) contraceptive device: Secondary | ICD-10-CM | POA: Diagnosis not present

## 2014-02-14 DIAGNOSIS — R1013 Epigastric pain: Secondary | ICD-10-CM | POA: Diagnosis present

## 2014-02-14 DIAGNOSIS — K8020 Calculus of gallbladder without cholecystitis without obstruction: Secondary | ICD-10-CM | POA: Diagnosis present

## 2014-02-14 HISTORY — PX: TONSILLECTOMY: SHX5217

## 2014-02-14 HISTORY — PX: TONSILLECTOMY: SUR1361

## 2014-02-14 HISTORY — PX: CHOLECYSTECTOMY: SHX55

## 2014-02-14 SURGERY — LAPAROSCOPIC CHOLECYSTECTOMY WITH INTRAOPERATIVE CHOLANGIOGRAM
Anesthesia: General | Site: Throat

## 2014-02-14 MED ORDER — ESMOLOL HCL 10 MG/ML IV SOLN
INTRAVENOUS | Status: AC
  Filled 2014-02-14: qty 10

## 2014-02-14 MED ORDER — ARTIFICIAL TEARS OP OINT
TOPICAL_OINTMENT | OPHTHALMIC | Status: AC
  Filled 2014-02-14: qty 3.5

## 2014-02-14 MED ORDER — DEXAMETHASONE SODIUM PHOSPHATE 10 MG/ML IJ SOLN
INTRAMUSCULAR | Status: AC
  Filled 2014-02-14: qty 1

## 2014-02-14 MED ORDER — LACTATED RINGERS IV SOLN
INTRAVENOUS | Status: DC | PRN
  Administered 2014-02-14: 09:00:00

## 2014-02-14 MED ORDER — ENOXAPARIN SODIUM 40 MG/0.4ML ~~LOC~~ SOLN
40.0000 mg | SUBCUTANEOUS | Status: DC
  Filled 2014-02-14 (×2): qty 0.4

## 2014-02-14 MED ORDER — DIPHENHYDRAMINE HCL 50 MG/ML IJ SOLN
INTRAMUSCULAR | Status: DC | PRN
  Administered 2014-02-14: 12.5 mg via INTRAVENOUS

## 2014-02-14 MED ORDER — GLYCOPYRROLATE 0.2 MG/ML IJ SOLN
INTRAMUSCULAR | Status: DC | PRN
  Administered 2014-02-14: 0.4 mg via INTRAVENOUS
  Administered 2014-02-14: 0.6 mg via INTRAVENOUS

## 2014-02-14 MED ORDER — DIPHENHYDRAMINE HCL 50 MG/ML IJ SOLN
INTRAMUSCULAR | Status: AC
  Filled 2014-02-14: qty 1

## 2014-02-14 MED ORDER — ROCURONIUM BROMIDE 100 MG/10ML IV SOLN
INTRAVENOUS | Status: DC | PRN
  Administered 2014-02-14: 50 mg via INTRAVENOUS
  Administered 2014-02-14: 10 mg via INTRAVENOUS

## 2014-02-14 MED ORDER — ESMOLOL HCL 10 MG/ML IV SOLN
INTRAVENOUS | Status: DC | PRN
  Administered 2014-02-14: 20 mg via INTRAVENOUS
  Administered 2014-02-14: 10 mg via INTRAVENOUS

## 2014-02-14 MED ORDER — HYDROMORPHONE HCL PF 1 MG/ML IJ SOLN
INTRAMUSCULAR | Status: AC
  Filled 2014-02-14: qty 1

## 2014-02-14 MED ORDER — 0.9 % SODIUM CHLORIDE (POUR BTL) OPTIME
TOPICAL | Status: DC | PRN
  Administered 2014-02-14: 1000 mL

## 2014-02-14 MED ORDER — LIDOCAINE HCL (CARDIAC) 20 MG/ML IV SOLN
INTRAVENOUS | Status: DC | PRN
  Administered 2014-02-14: 100 mg via INTRAVENOUS

## 2014-02-14 MED ORDER — SODIUM CHLORIDE 0.9 % IR SOLN
Status: DC | PRN
  Administered 2014-02-14: 1000 mL

## 2014-02-14 MED ORDER — BUPIVACAINE-EPINEPHRINE 0.25% -1:200000 IJ SOLN
INTRAMUSCULAR | Status: DC | PRN
  Administered 2014-02-14: 25 mL

## 2014-02-14 MED ORDER — NEOSTIGMINE METHYLSULFATE 10 MG/10ML IV SOLN
INTRAVENOUS | Status: AC
  Filled 2014-02-14: qty 1

## 2014-02-14 MED ORDER — MIDAZOLAM HCL 5 MG/5ML IJ SOLN
INTRAMUSCULAR | Status: DC | PRN
  Administered 2014-02-14: 2 mg via INTRAVENOUS

## 2014-02-14 MED ORDER — MIDAZOLAM HCL 2 MG/2ML IJ SOLN
INTRAMUSCULAR | Status: AC
  Filled 2014-02-14: qty 2

## 2014-02-14 MED ORDER — FENTANYL CITRATE 0.05 MG/ML IJ SOLN
INTRAMUSCULAR | Status: AC
  Filled 2014-02-14: qty 5

## 2014-02-14 MED ORDER — GLYCOPYRROLATE 0.2 MG/ML IJ SOLN
INTRAMUSCULAR | Status: AC
  Filled 2014-02-14: qty 4

## 2014-02-14 MED ORDER — LIDOCAINE HCL (CARDIAC) 20 MG/ML IV SOLN
INTRAVENOUS | Status: AC
  Filled 2014-02-14: qty 5

## 2014-02-14 MED ORDER — HYDROCODONE-ACETAMINOPHEN 7.5-325 MG/15ML PO SOLN
10.0000 mL | ORAL | Status: DC | PRN
  Administered 2014-02-14 – 2014-02-15 (×2): 10 mL via ORAL
  Filled 2014-02-14 (×2): qty 15

## 2014-02-14 MED ORDER — MORPHINE SULFATE 2 MG/ML IJ SOLN
1.0000 mg | INTRAMUSCULAR | Status: DC | PRN
  Administered 2014-02-14 – 2014-02-15 (×6): 2 mg via INTRAVENOUS
  Filled 2014-02-14 (×2): qty 1
  Filled 2014-02-14: qty 2
  Filled 2014-02-14: qty 1

## 2014-02-14 MED ORDER — BUPIVACAINE-EPINEPHRINE (PF) 0.25% -1:200000 IJ SOLN
INTRAMUSCULAR | Status: AC
  Filled 2014-02-14: qty 30

## 2014-02-14 MED ORDER — OXYMETAZOLINE HCL 0.05 % NA SOLN
NASAL | Status: DC | PRN
  Administered 2014-02-14: 1 via NASAL

## 2014-02-14 MED ORDER — ONDANSETRON HCL 4 MG/2ML IJ SOLN
INTRAMUSCULAR | Status: DC | PRN
  Administered 2014-02-14: 4 mg via INTRAVENOUS

## 2014-02-14 MED ORDER — ARTIFICIAL TEARS OP OINT
TOPICAL_OINTMENT | OPHTHALMIC | Status: DC | PRN
  Administered 2014-02-14: 1 via OPHTHALMIC

## 2014-02-14 MED ORDER — ROCURONIUM BROMIDE 50 MG/5ML IV SOLN
INTRAVENOUS | Status: AC
  Filled 2014-02-14: qty 1

## 2014-02-14 MED ORDER — DEXAMETHASONE SODIUM PHOSPHATE 10 MG/ML IJ SOLN
INTRAMUSCULAR | Status: DC | PRN
  Administered 2014-02-14: 10 mg via INTRAVENOUS

## 2014-02-14 MED ORDER — ONDANSETRON HCL 4 MG/2ML IJ SOLN
INTRAMUSCULAR | Status: AC
  Filled 2014-02-14: qty 2

## 2014-02-14 MED ORDER — POTASSIUM CHLORIDE IN NACL 20-0.9 MEQ/L-% IV SOLN
INTRAVENOUS | Status: DC
  Administered 2014-02-14 – 2014-02-15 (×2): via INTRAVENOUS
  Filled 2014-02-14 (×4): qty 1000

## 2014-02-14 MED ORDER — ONDANSETRON HCL 4 MG PO TABS
4.0000 mg | ORAL_TABLET | Freq: Four times a day (QID) | ORAL | Status: DC | PRN
  Administered 2014-02-15: 4 mg via ORAL
  Filled 2014-02-14: qty 1

## 2014-02-14 MED ORDER — OXYMETAZOLINE HCL 0.05 % NA SOLN
NASAL | Status: AC
  Filled 2014-02-14: qty 15

## 2014-02-14 MED ORDER — PHENOL 1.4 % MT LIQD: 1.0000 | OROMUCOSAL | Status: DC | PRN

## 2014-02-14 MED ORDER — ONDANSETRON HCL 4 MG/2ML IJ SOLN
4.0000 mg | Freq: Four times a day (QID) | INTRAMUSCULAR | Status: DC | PRN
  Administered 2014-02-14: 4 mg via INTRAVENOUS
  Filled 2014-02-14: qty 2

## 2014-02-14 MED ORDER — LACTATED RINGERS IV SOLN
INTRAVENOUS | Status: DC | PRN
  Administered 2014-02-14 (×2): via INTRAVENOUS

## 2014-02-14 MED ORDER — SODIUM CHLORIDE 0.9 % IV SOLN
INTRAVENOUS | Status: DC | PRN
  Administered 2014-02-14: 08:00:00

## 2014-02-14 MED ORDER — CHLORHEXIDINE GLUCONATE 4 % EX LIQD
1.0000 "application " | Freq: Once | CUTANEOUS | Status: DC
  Filled 2014-02-14: qty 15

## 2014-02-14 MED ORDER — PANTOPRAZOLE SODIUM 40 MG IV SOLR
40.0000 mg | INTRAVENOUS | Status: DC
  Administered 2014-02-14: 40 mg via INTRAVENOUS
  Filled 2014-02-14: qty 40

## 2014-02-14 MED ORDER — HYDROMORPHONE HCL PF 1 MG/ML IJ SOLN
0.2500 mg | INTRAMUSCULAR | Status: DC | PRN
  Administered 2014-02-14 (×3): 0.25 mg via INTRAVENOUS
  Administered 2014-02-14: 0.5 mg via INTRAVENOUS

## 2014-02-14 MED ORDER — LIDOCAINE-EPINEPHRINE 1 %-1:100000 IJ SOLN
INTRAMUSCULAR | Status: AC
Start: 2014-02-14 — End: 2014-02-14
  Filled 2014-02-14: qty 1

## 2014-02-14 MED ORDER — PROPOFOL 10 MG/ML IV BOLUS
INTRAVENOUS | Status: AC
  Filled 2014-02-14: qty 20

## 2014-02-14 MED ORDER — PROPOFOL 10 MG/ML IV BOLUS
INTRAVENOUS | Status: DC | PRN
  Administered 2014-02-14: 170 mg via INTRAVENOUS
  Administered 2014-02-14: 30 mg via INTRAVENOUS

## 2014-02-14 MED ORDER — NEOSTIGMINE METHYLSULFATE 10 MG/10ML IV SOLN
INTRAVENOUS | Status: DC | PRN
  Administered 2014-02-14: 4 mg via INTRAVENOUS
  Administered 2014-02-14: 1 mg via INTRAVENOUS

## 2014-02-14 MED ORDER — CHLORHEXIDINE GLUCONATE 4 % EX LIQD: 1.0000 "application " | Freq: Once | CUTANEOUS | Status: DC

## 2014-02-14 MED ORDER — FENTANYL CITRATE 0.05 MG/ML IJ SOLN
INTRAMUSCULAR | Status: DC | PRN
  Administered 2014-02-14: 50 ug via INTRAVENOUS
  Administered 2014-02-14 (×2): 100 ug via INTRAVENOUS

## 2014-02-14 SURGICAL SUPPLY — 72 items
APL SKNCLS STERI-STRIP NONHPOA (GAUZE/BANDAGES/DRESSINGS) ×2
APPLIER CLIP 5 13 M/L LIGAMAX5 (MISCELLANEOUS) ×4
APR CLP MED LRG 5 ANG JAW (MISCELLANEOUS) ×2
BAG SPEC RTRVL LRG 6X4 10 (ENDOMECHANICALS) ×2
BANDAGE ADH SHEER 1  50/CT (GAUZE/BANDAGES/DRESSINGS) ×12 IMPLANT
BENZOIN TINCTURE PRP APPL 2/3 (GAUZE/BANDAGES/DRESSINGS) ×4 IMPLANT
BLADE SURG ROTATE 9660 (MISCELLANEOUS) IMPLANT
CANISTER SUCTION 2500CC (MISCELLANEOUS) ×8 IMPLANT
CATH ROBINSON RED A/P 10FR (CATHETERS) IMPLANT
CHLORAPREP W/TINT 26ML (MISCELLANEOUS) ×4 IMPLANT
CLEANER TIP ELECTROSURG 2X2 (MISCELLANEOUS) ×4 IMPLANT
CLIP APPLIE 5 13 M/L LIGAMAX5 (MISCELLANEOUS) ×2 IMPLANT
CLOSURE STERI-STRIP 1/2X4 (GAUZE/BANDAGES/DRESSINGS) ×1
CLSR STERI-STRIP ANTIMIC 1/2X4 (GAUZE/BANDAGES/DRESSINGS) ×1 IMPLANT
COAGULATOR SUCT 6 FR SWTCH (ELECTROSURGICAL) ×1
COAGULATOR SUCT SWTCH 10FR 6 (ELECTROSURGICAL) ×3 IMPLANT
CONT SPEC 4OZ CLIKSEAL STRL BL (MISCELLANEOUS) ×6 IMPLANT
COVER MAYO STAND STRL (DRAPES) ×4 IMPLANT
COVER SURGICAL LIGHT HANDLE (MISCELLANEOUS) ×4 IMPLANT
DECANTER SPIKE VIAL GLASS SM (MISCELLANEOUS) ×4 IMPLANT
DRAPE C-ARM 42X72 X-RAY (DRAPES) ×4 IMPLANT
DRAPE UTILITY 15X26 W/TAPE STR (DRAPE) ×8 IMPLANT
DRSG TEGADERM 2-3/8X2-3/4 SM (GAUZE/BANDAGES/DRESSINGS) ×12 IMPLANT
DRSG TEGADERM 4X4.75 (GAUZE/BANDAGES/DRESSINGS) ×4 IMPLANT
ELECT COATED BLADE 2.86 ST (ELECTRODE) ×4 IMPLANT
ELECT REM PT RETURN 9FT ADLT (ELECTROSURGICAL) ×4
ELECT REM PT RETURN 9FT PED (ELECTROSURGICAL)
ELECTRODE REM PT RETRN 9FT PED (ELECTROSURGICAL) IMPLANT
ELECTRODE REM PT RTRN 9FT ADLT (ELECTROSURGICAL) ×2 IMPLANT
GAUZE SPONGE 2X2 8PLY STRL LF (GAUZE/BANDAGES/DRESSINGS) ×2 IMPLANT
GAUZE SPONGE 4X4 16PLY XRAY LF (GAUZE/BANDAGES/DRESSINGS) ×4 IMPLANT
GLOVE BIO SURGEON STRL SZ 6.5 (GLOVE) ×1 IMPLANT
GLOVE BIO SURGEON STRL SZ7 (GLOVE) ×2 IMPLANT
GLOVE BIO SURGEONS STRL SZ 6.5 (GLOVE) ×1
GLOVE BIOGEL M STRL SZ7.5 (GLOVE) ×4 IMPLANT
GLOVE BIOGEL PI IND STRL 8 (GLOVE) ×2 IMPLANT
GLOVE BIOGEL PI INDICATOR 8 (GLOVE) ×2
GLOVE SURG SS PI 7.5 STRL IVOR (GLOVE) ×4 IMPLANT
GOWN STRL REUS W/ TWL LRG LVL3 (GOWN DISPOSABLE) ×8 IMPLANT
GOWN STRL REUS W/ TWL XL LVL3 (GOWN DISPOSABLE) ×2 IMPLANT
GOWN STRL REUS W/TWL LRG LVL3 (GOWN DISPOSABLE) ×16
GOWN STRL REUS W/TWL XL LVL3 (GOWN DISPOSABLE) ×4
KIT BASIN OR (CUSTOM PROCEDURE TRAY) ×8 IMPLANT
KIT ROOM TURNOVER OR (KITS) ×8 IMPLANT
NS IRRIG 1000ML POUR BTL (IV SOLUTION) ×8 IMPLANT
PACK SURGICAL SETUP 50X90 (CUSTOM PROCEDURE TRAY) ×4 IMPLANT
PAD ARMBOARD 7.5X6 YLW CONV (MISCELLANEOUS) ×12 IMPLANT
PENCIL BUTTON HOLSTER BLD 10FT (ELECTRODE) ×4 IMPLANT
POUCH SPECIMEN RETRIEVAL 10MM (ENDOMECHANICALS) ×4 IMPLANT
SCISSORS LAP 5X35 DISP (ENDOMECHANICALS) ×4 IMPLANT
SET CHOLANGIOGRAPH 5 50 .035 (SET/KITS/TRAYS/PACK) ×4 IMPLANT
SET IRRIG TUBING LAPAROSCOPIC (IRRIGATION / IRRIGATOR) ×4 IMPLANT
SLEEVE ENDOPATH XCEL 5M (ENDOMECHANICALS) ×8 IMPLANT
SPECIMEN JAR SMALL (MISCELLANEOUS) ×4 IMPLANT
SPONGE GAUZE 2X2 STER 10/PKG (GAUZE/BANDAGES/DRESSINGS) ×2
SPONGE TONSIL 1 RF SGL (DISPOSABLE) IMPLANT
SUT MNCRL AB 4-0 PS2 18 (SUTURE) ×4 IMPLANT
SUT VICRYL 0 UR6 27IN ABS (SUTURE) ×2 IMPLANT
SYR BULB 3OZ (MISCELLANEOUS) ×4 IMPLANT
SYRINGE CONTROL L 12CC (SYRINGE) ×4 IMPLANT
SYRINGE CONTROL LL 12CC (SYRINGE) IMPLANT
TOWEL OR 17X24 6PK STRL BLUE (TOWEL DISPOSABLE) ×12 IMPLANT
TOWEL OR 17X26 10 PK STRL BLUE (TOWEL DISPOSABLE) ×4 IMPLANT
TRAY LAPAROSCOPIC (CUSTOM PROCEDURE TRAY) ×4 IMPLANT
TROCAR XCEL BLUNT TIP 100MML (ENDOMECHANICALS) ×4 IMPLANT
TROCAR XCEL NON-BLD 5MMX100MML (ENDOMECHANICALS) ×4 IMPLANT
TUBE CONNECTING 12'X1/4 (SUCTIONS) ×1
TUBE CONNECTING 12X1/4 (SUCTIONS) ×3 IMPLANT
TUBE SALEM SUMP 12R W/ARV (TUBING) IMPLANT
TUBING INSUF HEATED (TUBING) ×3 IMPLANT
WATER STERILE IRR 1000ML POUR (IV SOLUTION) IMPLANT
YANKAUER SUCT BULB TIP NO VENT (SUCTIONS) ×4 IMPLANT

## 2014-02-14 NOTE — Anesthesia Preprocedure Evaluation (Signed)
Anesthesia Evaluation  Patient identified by MRN, date of birth, ID band Patient awake  General Assessment Comment:History noted. CE  Reviewed: Allergy & Precautions, H&P , NPO status   Airway Mallampati: II      Dental   Pulmonary pneumonia -,  breath sounds clear to auscultation        Cardiovascular Rhythm:Regular Rate:Normal     Neuro/Psych    GI/Hepatic negative GI ROS, Neg liver ROS,   Endo/Other  negative endocrine ROS  Renal/GU negative Renal ROS     Musculoskeletal   Abdominal   Peds  Hematology   Anesthesia Other Findings   Reproductive/Obstetrics                           Anesthesia Physical Anesthesia Plan  ASA: II  Anesthesia Plan: General   Post-op Pain Management:    Induction: Intravenous  Airway Management Planned: Oral ETT  Additional Equipment:   Intra-op Plan:   Post-operative Plan: Possible Post-op intubation/ventilation  Informed Consent: I have reviewed the patients History and Physical, chart, labs and discussed the procedure including the risks, benefits and alternatives for the proposed anesthesia with the patient or authorized representative who has indicated his/her understanding and acceptance.   Dental advisory given  Plan Discussed with: CRNA and Anesthesiologist  Anesthesia Plan Comments:         Anesthesia Quick Evaluation

## 2014-02-14 NOTE — Discharge Instructions (Addendum)
[facility] LAPAROSCOPIC SURGERY: POST OP INSTRUCTIONS Always review your discharge instruction sheet given to you by the facility where your surgery was performed. IF YOU HAVE DISABILITY OR FAMILY LEAVE FORMS, YOU MUST BRING THEM TO THE OFFICE FOR PROCESSING.   DO NOT GIVE THEM TO YOUR DOCTOR.  1. A prescription for pain medication may be given to you upon discharge.  Take your pain medication as prescribed, if needed.  If narcotic pain medicine is not needed, then you may take acetaminophen (Tylenol) or ibuprofen (Advil) as needed. 2. Take your usually prescribed medications unless otherwise directed. 3. If you need a refill on your pain medication, please contact your pharmacy.  They will contact our office to request authorization. Prescriptions will not be filled after 5pm or on week-ends. 4. You should follow a light diet the first few days after arrival home, such as soup and crackers, etc.  Be sure to include lots of fluids daily. ALSO REVIEW DIET FROM DR GORE 5. Most patients will experience some swelling and bruising in the area of the incisions.  Ice packs will help.  Swelling and bruising can take several days to resolve.  6. It is common to experience some constipation if taking pain medication after surgery.  Increasing fluid intake and taking a stool softener (such as Colace) will usually help or prevent this problem from occurring.  A mild laxative (Milk of Magnesia or Miralax) should be taken according to package instructions if there are no bowel movements after 48 hours. 7. Unless discharge instructions indicate otherwise, you may remove your bandages 48 hours after surgery, and you may shower at that time.  You  have steri-strips (small skin tapes) in place directly over the incision.  These strips should be left on the skin for 7-10 days.   8. ACTIVITIES:  You may resume regular (light) daily activities beginning the next day--such as daily self-care, walking,  climbing stairs--gradually increasing activities as tolerated.  You may have sexual intercourse when it is comfortable.  Refrain from any heavy lifting or straining until approved by your doctor. a. You may drive when you are no longer taking prescription pain medication, you can comfortably wear a seatbelt, and you can safely maneuver your car and apply brakes. 9. You should see your doctor in the office for a follow-up appointment approximately 2-3 weeks after your surgery.  Make sure that you call for this appointment within a day or two after you arrive home to insure a convenient appointment time. 10. OTHER INSTRUCTIONS: FOLLOW INSTRUCTIONS AS WELL FROM DR GORE  WHEN TO CALL YOUR DOCTOR: 1. Fever over 101.0 2. Inability to urinate 3. Continued bleeding from incision. 4. Increased pain, redness, or drainage from the incision. 5. Increasing abdominal pain  The clinic staff is available to answer your questions during regular business hours.  Please dont hesitate to call and ask to speak to one of the nurses for clinical concerns.  If you have a medical emergency, go to the nearest emergency room or call 911.  A surgeon from [facility] is always on call at the hospital. [address], [address], [place], [place]  [address] ? [address] [address], [place], [place]   [address] [phone] ? [phone] ? FAX [phone] Web site: [email or web address]   Post-tonsillectomy diet as tolerated, Rx for omnicef and zofran sent to pharmacy, Rx for liquid hydrocodone/acetaminophen given to family. Follow up with Dr. Emeline Darling at [facility] in 3 to 4  weeks.  General Anesthesia, Adult, Care After  Refer to this sheet in the next few weeks. These instructions provide you with information on caring for yourself after your procedure. Your health care provider may also give you more specific instructions. Your treatment has been planned according to current medical practices, but  problems sometimes occur. Call your health care provider if you have any problems or questions after your procedure.  WHAT TO EXPECT AFTER THE PROCEDURE  After the procedure, it is typical to experience:  Sleepiness.  Nausea and vomiting. HOME CARE INSTRUCTIONS  For the first 24 hours after general anesthesia:  Have a responsible person with you.  Do not drive a car. If you are alone, do not take public transportation.  Do not drink alcohol.  Do not take medicine that has not been prescribed by your health care provider.  Do not sign important papers or make important decisions.  You may resume a normal diet and activities as directed by your health care provider.  Change bandages (dressings) as directed.  If you have questions or problems that seem related to general anesthesia, call the hospital and ask for the anesthetist or anesthesiologist on call. SEEK MEDICAL CARE IF:  You have nausea and vomiting that continue the day after anesthesia.  You develop a rash. SEEK IMMEDIATE MEDICAL CARE IF:  You have difficulty breathing.  You have chest pain.  You have any allergic problems. Document Released: 09/05/2000 Document Revised: 01/30/2013 Document Reviewed: 12/13/2012  [facility] Patient Information 2014 [place], [place].   What to eat:  For your first meals, you should eat lightly; only small meals initially.  If you do not have nausea, you may eat larger meals.  Avoid spicy, greasy and heavy food.

## 2014-02-14 NOTE — Op Note (Signed)
Jennifer Bolton [id number] March 23, 1981 02/14/2014  Laparoscopic Cholecystectomy with IOC Procedure Note  Indications: This patient presents with symptomatic gallbladder disease and will undergo laparoscopic cholecystectomy. The patient also has h/o recurrent strep throat and will also undergo a tonsillectomy under the same anesthetic- please see Dr Ellyn Hack separate op note   Pre-operative Diagnosis: symptomatic cholelithiasis  Post-operative Diagnosis: Same  Surgeon: Atilano Ina   Assistants: none  Anesthesia: General endotracheal anesthesia  ASA Class: 2  Procedure Details  The patient was seen again in the Holding Room. The risks, benefits, complications, treatment options, and expected outcomes were discussed with the patient. The possibilities of reaction to medication, pulmonary aspiration, perforation of viscus, bleeding, recurrent infection, finding a normal gallbladder, the need for additional procedures, failure to diagnose a condition, the possible need to convert to an open procedure, and creating a complication requiring transfusion or operation were discussed with the patient. The likelihood of improving the patient's symptoms with return to their baseline status is good.  The patient and/or family concurred with the proposed plan, giving informed consent. The site of surgery properly noted. The patient was taken to Operating Room, identified as Jennifer Bolton and the procedure verified as Laparoscopic Cholecystectomy with Intraoperative Cholangiogram. A Time Out was held and the above information confirmed. Antibiotic prophylaxis was administered.   Prior to the induction of general anesthesia, antibiotic prophylaxis was administered. General endotracheal anesthesia was then administered and tolerated well. After the induction, the abdomen was prepped with Chloraprep and draped in the sterile fashion. The patient was positioned in the supine position.  Local anesthetic agent was  injected into the skin near the umbilicus and an incision made. We dissected down to the abdominal fascia with blunt dissection.  The fascia was incised vertically and we entered the peritoneal cavity bluntly.  A pursestring suture of 0-Vicryl was placed around the fascial opening.  The Hasson cannula was inserted and secured with the stay suture.  Pneumoperitoneum was then created with CO2 and tolerated well without any adverse changes in the patient's vital signs. An 5-mm port was placed in the subxiphoid position.  Two 5-mm ports were placed in the right upper quadrant. All skin incisions were infiltrated with a local anesthetic agent before making the incision and placing the trocars.   We positioned the patient in reverse Trendelenburg, tilted slightly to the patient's left.  The gallbladder was identified, the fundus grasped and retracted cephalad. Adhesions were lysed bluntly and with the electrocautery where indicated, taking care not to injure any adjacent organs or viscus. The infundibulum was grasped and retracted laterally, exposing the peritoneum overlying the triangle of Calot. This was then divided and exposed in a blunt fashion. A critical view of the cystic duct and cystic artery was obtained.  The cystic duct was clearly identified and bluntly dissected circumferentially. The cystic duct was ligated with a clip distally.   An incision was made in the cystic duct and the [facility] cholangiogram catheter introduced. The catheter was secured using a clip. A cholangiogram was then obtained which showed good visualization of the distal and proximal biliary tree with no sign of filling defects or obstruction.  Contrast flowed easily into the duodenum. The catheter was then removed.   The cystic duct was then ligated with 3 clips on the biliary side and divided. The cystic artery which had been identified and dissected free was ligated with clips and divided as well.   The gallbladder was dissected from  the liver bed in retrograde  fashion with the electrocautery. There was some spillage of bile from the gallbladder while taking it down along with 3 stones spilling out. The gallbladder and the 3 stones were removed and placed in an Endocatch sac.  The gallbladder and stones and Endocatch sac were then removed through the umbilical port site. The liver bed was irrigated and inspected. Hemostasis was achieved with the electrocautery. Copious irrigation was utilized and was repeatedly aspirated until clear.  The pursestring suture was used to close the umbilical fascia.  An additional interrupted 0-vicryl suture was placed at the umbilical fascia given her obesity  We again inspected the right upper quadrant for hemostasis.  The umbilical closure was inspected and there was no air leak and nothing trapped within the closure. Pneumoperitoneum was released as we removed the trocars.  4-0 Monocryl was used to close the skin.   Benzoin, steri-strips, and clean dressings were applied. The patient was then extubated and brought to the recovery room in stable condition. Instrument, sponge, and needle counts were correct at closure and at the conclusion of the case. At this point, Dr Emeline Darling entered OR and the patient was setup for her tonsillectomy. Please see his op note  Findings: Chronic Cholecystitis with Cholelithiasis  Estimated Blood Loss: Minimal         Drains: none         Specimens: Gallbladder           Complications: None; patient tolerated the procedure well.         Disposition: PACU - hemodynamically stable.         Condition: stable  Mary Sella. Andrey Campanile, MD, FACS General, Bariatric, & Minimally Invasive Surgery [facility] Surgery, [place]

## 2014-02-14 NOTE — Op Note (Signed)
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  ESTIMATED BLOOD LOSS: less than 10 mL.  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 The bed was turned 90 degrees and the Crowe-Davis mouth retractor was placed over the endotracheal tube and suspended from the Mayo stand. The palate was inspected and palpated and noted to be intact with no submucous cleft. The uvula was midline and normal. The right tonsil was grasped with a curved Allis clamp and dissected from the right tonsillar fossa using the Bovie. Meticulous hemostasis was then achieved. The left tonsil was then grasped with the curved Allis and dissected from the left tonsillar fossa using the Bovie. Meticulous hemostasis was achieved. The nasal cavity and oropharynx were irrigated out and then the the nose, oral cavity,  and stomach were suctioned out. The patient was turned back to anesthesia and awakened from anesthesia and extubated without difficulty. The patient tolerated the procedure well with no immediate complications and was taken to the postoperative recovery area in good condition.   Dr. Melvenia Beam was present and performed the entire tonsillectomy procedure. 02/14/2014  9:36 AM Melvenia Beam

## 2014-02-14 NOTE — Transfer of Care (Signed)
Immediate Anesthesia Transfer of Care Note  Patient: Jennifer Bolton  Procedure(s) Performed: Procedure(s): LAPAROSCOPIC CHOLECYSTECTOMY WITH INTRAOPERATIVE CHOLANGIOGRAM (N/A) BILATERAL TONSILLECTOMY (Bilateral)  Patient Location: PACU  Anesthesia Type:General  Level of Consciousness: awake, alert , patient cooperative and lethargic  Airway & Oxygen Therapy: Patient Spontanous Breathing and Patient connected to nasal cannula oxygen  Post-op Assessment: Report given to PACU RN and Post -op Vital signs reviewed and stable  Post vital signs: Reviewed and stable  Complications: No apparent anesthesia complications

## 2014-02-14 NOTE — H&P (Signed)
Jennifer Bolton is an 33 y.o. female.   Chief Complaint: here for surgery HPI: 34 yo WF referred by Dr Linwood Dibbles for evaluation of gallbladder disease. The patient states that she had acute onset of epigastric pain a few weeks ago that came in waves. It went away but returned a short time later. The next day she didn't eat much because of concerns of pain. The following week if she ate anything it would cause epigastric pain. For the past 3 days she's been able to use without any discomfort. She denies any nausea, vomiting, diarrhea or constipation. She denies any melena or hematochezia. She denies any acholic stools. She does not take NSAIDs on a frequent occasion. However she did try some Aleve during one of the intense episodes which did help a little bit.  She states that she might have had one episode of epigastric pain a few years ago but it wasn't nearly as intense. Her mother has had gallbladder problems. Her initial pain after eating spicy [place] food. She went to the emergency room on June 29 and underwent labs as well as an ultrasound. Her labs were normal and her ultrasound showed multiple gallstones.  She does relate a history of recurrent episodes of strep throat and sore throat and states that she's been told in the past that she needs to have her tonsils out. She is wondering if it's potential to have both procedures done at the same time if she is determined to need a tonsillectomy  Of note she says that her maternal grandmother passed away and die from complications during surgery in 2001. She states it's unclear whether or not it was reaction to anesthesia or her underlying medical issues. The patient herself has had general anesthesia during knee surgery without any complications.  History reviewed. No pertinent past medical history.   Past Medical History  Diagnosis Date  . Family history of anesthesia complication     maternal grandmother did not wake up after anesthesia due to her  heart problems  . Pneumonia     years ago  . ADD (attention deficit disorder)     on meds  . ZOXWRUEA(540.9)     Past Surgical History  Procedure Laterality Date  . Knee arthroscopy    . Wisdom tooth extraction    . Cervical ablation      History reviewed. No pertinent family history. Social History:  reports that she has never smoked. She has never used smokeless tobacco. She reports that she drinks alcohol. She reports that she does not use illicit drugs.  Allergies: No Known Allergies  Medications Prior to Admission  Medication Sig Dispense Refill  . amphetamine-dextroamphetamine (ADDERALL) 30 MG tablet Take 30 mg by mouth 2 (two) times daily.      Marland Kitchen HYDROcodone-acetaminophen (NORCO/VICODIN) 5-325 MG per tablet Take 1-2 tablets by mouth every 4 (four) hours as needed (pain).      Marland Kitchen levonorgestrel (MIRENA) 20 MCG/24HR IUD 1 each by Intrauterine route once.      Marland Kitchen zolpidem (AMBIEN) 5 MG tablet Take 5 mg by mouth at bedtime as needed for sleep.        No results found for this or any previous visit (from the past 48 hour(s)). No results found.  Review of Systems  Unable to perform ROS Gastrointestinal: Positive for abdominal pain. Negative for nausea and vomiting.    Blood pressure 102/76, pulse 86, temperature 98.7 F (37.1 C), temperature source Oral, resp. rate 18, SpO2 99.00%. Physical  Exam  Vitals reviewed. Constitutional: She is oriented to person, place, and time. She appears well-developed and well-nourished. No distress.  HENT:  Head: Normocephalic and atraumatic.  Right Ear: External ear normal.  Left Ear: External ear normal.  Eyes: Conjunctivae are normal. No scleral icterus.  Neck: Normal range of motion. Neck supple. No tracheal deviation present. No thyromegaly present.  Cardiovascular: Normal rate and normal heart sounds.   Respiratory: Effort normal and breath sounds normal. No stridor. No respiratory distress. She has no wheezes.  GI: Soft. She  exhibits no distension. There is no tenderness. There is no rebound and no guarding.  Musculoskeletal: She exhibits no edema and no tenderness.  Lymphadenopathy:    She has no cervical adenopathy.  Neurological: She is alert and oriented to person, place, and time. She exhibits normal muscle tone.  Skin: Skin is warm and dry. No rash noted. She is not diaphoretic. No erythema. No pallor.  Psychiatric: She has a normal mood and affect. Her behavior is normal. Judgment and thought content normal.     Assessment/Plan Symptomatic cholelithiasis  To OR for surgery for combo tonsillectomy and gallbladder surgery All questions asked and answered  Mary Sella. Andrey Campanile, MD, FACS General, Bariatric, & Minimally Invasive Surgery [facility] Surgery, [place]   [facility] M 02/14/2014, 7:24 AM

## 2014-02-15 DIAGNOSIS — K8010 Calculus of gallbladder with chronic cholecystitis without obstruction: Secondary | ICD-10-CM | POA: Diagnosis not present

## 2014-02-15 LAB — CBC
HCT: 36.6 % (ref 36.0–46.0)
HEMOGLOBIN: 12 g/dL (ref 12.0–15.0)
MCH: 29.3 pg (ref 26.0–34.0)
MCHC: 32.8 g/dL (ref 30.0–36.0)
MCV: 89.3 fL (ref 78.0–100.0)
Platelets: 141 10*3/uL — ABNORMAL LOW (ref 150–400)
RBC: 4.1 MIL/uL (ref 3.87–5.11)
RDW: 12.2 % (ref 11.5–15.5)
WBC: 13.7 10*3/uL — ABNORMAL HIGH (ref 4.0–10.5)

## 2014-02-15 NOTE — Progress Notes (Signed)
Pt. refused Lovenox.  Reeducated about its importance and will ambulate.

## 2014-02-15 NOTE — Progress Notes (Signed)
1 Day Post-Op  Subjective: Doing well, pain controlled, tol clears  Objective: Vital signs in last 24 hours: Temp:  [97.3 F (36.3 C)-98.8 F (37.1 C)] 98.1 F (36.7 C) (09/05 0620) Pulse Rate:  [57-98] 73 (09/05 0620) Resp:  [13-19] 19 (09/05 0620) BP: (90-132)/(53-87) 90/60 mmHg (09/05 0620) SpO2:  [91 %-99 %] 98 % (09/05 0620) Weight:  [242 lb (109.77 kg)] 242 lb (109.77 kg) (09/04 2129) Last BM Date: 02/13/14  Intake/Output from previous day: 09/04 0701 - 09/05 0700 In: 2490 [P.O.:240; I.V.:2250] Out: 675 [Urine:600; Blood:75] Intake/Output this shift:    GI: incisions with dressings clean without infection  Lab Results:   Recent Labs  02/15/14 0401  WBC 13.7*  HGB 12.0  HCT 36.6  PLT 141*   BMET No results found for this basename: NA, K, CL, CO2, GLUCOSE, BUN, CREATININE, CALCIUM,  in the last 72 hours PT/INR No results found for this basename: LABPROT, INR,  in the last 72 hours ABG No results found for this basename: PHART, PCO2, PO2, HCO3,  in the last 72 hours  Studies/Results: Dg Cholangiogram Operative  02/14/2014   CLINICAL DATA:  Chronic cholecystitis.  EXAM: INTRAOPERATIVE CHOLANGIOGRAM  TECHNIQUE: Cholangiographic images from the C-arm fluoroscopic device were submitted for interpretation post-operatively. Please see the procedural report for the amount of contrast and the fluoroscopy time utilized.  COMPARISON:  None.  FINDINGS: Under fluoroscopy, contrast was injected into the cannulated cystic duct remnant. Mild intrahepatic dilatation is noted as well as mild dilatation of the common bile duct. Small rounded filling defect is seen in the proximal common bile duct ; is uncertain if this represents retained gallstone or possibly air bubble. Antegrade flow into the duodenum is noted. There is noted a rounded defect in the distal common bile duct near the ampulla, and it is uncertain if this represents retained stone or simply due to edematous ampulla.   IMPRESSION: Filling defect is seen in the proximal common bile duct which may represent small retained stone or air bubble. Antegrade flow into the duodenum is noted. Also noted is possible defect in the distal common bile duct near the ampulla ; it is uncertain if this represents retained stone or possibly edematous ampulla.   Electronically Signed   By: Roque Lias M.D.   On: 02/14/2014 09:36    Anti-infectives: Anti-infectives   Start     Dose/Rate Route Frequency Ordered Stop   02/14/14 0600  cefOXitin (MEFOXIN) 2 g in dextrose 5 % 50 mL IVPB     2 g 100 mL/hr over 30 Minutes Intravenous On call to O.R. 02/13/14 1421 02/14/14 0745      Assessment/Plan: Pod 1 s/p lap chole/tonsillectomy  Dc home today  [facility] 02/15/2014

## 2014-02-18 ENCOUNTER — Encounter (HOSPITAL_COMMUNITY): Payer: Self-pay | Admitting: General Surgery

## 2014-02-20 DIAGNOSIS — J351 Hypertrophy of tonsils: Secondary | ICD-10-CM | POA: Diagnosis present

## 2014-02-20 NOTE — Discharge Summary (Signed)
Physician Discharge Summary  Helia Haese ZOX:[id number] DOB: 1981-03-01 DOA: 02/14/2014  PCP: Fortino Sic, MD  Admit date: 02/14/2014 Discharge date: 02/15/2014  Recommendations for Outpatient Follow-up:    Follow-up Information   Follow up with Atilano Ina, MD On 03/05/2014. (11 AM (please arrive 10:45 AM) , For wound re-check)    Specialty:  General Surgery   Contact information:   [address] [place] [address] [phone]       Follow up with Melvenia Beam, MD. Call in 4 weeks.   Specialty:  Otolaryngology   Contact information:   [address] [place] [address] [phone]       Follow up with Atilano Ina, MD In 2 weeks.   Specialty:  General Surgery   Contact information:   [address] [place] [address] [phone]      Discharge Diagnoses:  Active Problems:   Symptomatic cholelithiasis   Hx laparoscopic cholecystectomy   Tonsillar hypertrophy  Surgical Procedure: Laparoscopic cholecystectomy with IOC, bilateral tonsillectomy  Discharge Condition: good Disposition: home  Diet recommendation: regular  Filed Weights   02/14/14 2129  Weight: 242 lb (109.77 kg)    Hospital Course:  33 yo WF taken to surgery for symptomatic cholelithiasis and bilateral tonsillar hypertrophy with h/o recurrent strep. She underwent Lap cholecystectomy with IOC and Dr Emeline Darling did bilateral tonsillectomy. She was kept overnight for pain control and observation. On pod 1 her vitals were stable and tolerating a diet and she was discharged to home.    Discharge Instructions     Medication List         amphetamine-dextroamphetamine 30 MG tablet  Commonly known as:  ADDERALL  Take 30 mg by mouth 2 (two) times daily.     HYDROcodone-acetaminophen 5-325 MG per tablet  Commonly known as:  NORCO/VICODIN  Take 1-2 tablets by mouth every 4 (four) hours as needed (pain).     levonorgestrel 20 MCG/24HR IUD  Commonly  known as:  MIRENA  1 each by Intrauterine route once.     zolpidem 5 MG tablet  Commonly known as:  AMBIEN  Take 5 mg by mouth at bedtime as needed for sleep.           Follow-up Information   Follow up with Atilano Ina, MD On 03/05/2014. (11 AM (please arrive 10:45 AM) , For wound re-check)    Specialty:  General Surgery   Contact information:   [address] [place] [address] [phone]       Follow up with Melvenia Beam, MD. Call in 4 weeks.   Specialty:  Otolaryngology   Contact information:   [address] [place] [address] [phone]       Follow up with Atilano Ina, MD In 2 weeks.   Specialty:  General Surgery   Contact information:   [address] [place] [address] [phone]        The results of significant diagnostics from this hospitalization (including imaging, microbiology, ancillary and laboratory) are listed below for reference.    Significant Diagnostic Studies: Dg Cholangiogram Operative  02/14/2014   CLINICAL DATA:  Chronic cholecystitis.  EXAM: INTRAOPERATIVE CHOLANGIOGRAM  TECHNIQUE: Cholangiographic images from the C-arm fluoroscopic device were submitted for interpretation post-operatively. Please see the procedural report for the amount of contrast and the fluoroscopy time utilized.  COMPARISON:  None.  FINDINGS: Under fluoroscopy, contrast was injected into the  cannulated cystic duct remnant. Mild intrahepatic dilatation is noted as well as mild dilatation of the common bile duct. Small rounded filling defect is seen in the proximal common bile duct ; is uncertain if this represents retained gallstone or possibly air bubble. Antegrade flow into the duodenum is noted. There is noted a rounded defect in the distal common bile duct near the ampulla, and it is uncertain if this represents retained stone or simply due to edematous ampulla.  IMPRESSION: Filling defect is seen in the proximal common  bile duct which may represent small retained stone or air bubble. Antegrade flow into the duodenum is noted. Also noted is possible defect in the distal common bile duct near the ampulla ; it is uncertain if this represents retained stone or possibly edematous ampulla.   Electronically Signed   By: Roque Lias M.D.   On: 02/14/2014 09:36    Microbiology: No results found for this or any previous visit (from the past 240 hour(s)).   Labs: Basic Metabolic Panel: No results found for this basename: NA, K, CL, CO2, GLUCOSE, BUN, CREATININE, CALCIUM, MG, PHOS,  in the last 168 hours Liver Function Tests: No results found for this basename: AST, ALT, ALKPHOS, BILITOT, PROT, ALBUMIN,  in the last 168 hours No results found for this basename: LIPASE, AMYLASE,  in the last 168 hours No results found for this basename: AMMONIA,  in the last 168 hours CBC:  Recent Labs Lab 02/15/14 0401  WBC 13.7*  HGB 12.0  HCT 36.6  MCV 89.3  PLT 141*   Cardiac Enzymes: No results found for this basename: CKTOTAL, CKMB, CKMBINDEX, TROPONINI,  in the last 168 hours BNP: BNP (last 3 results) No results found for this basename: PROBNP,  in the last 8760 hours CBG: No results found for this basename: GLUCAP,  in the last 168 hours  Active Problems:   Hx laparoscopic cholecystectomy   Time coordinating discharge: 10 minutes   Signed:  Atilano Ina, MD [facility] Surgery, [place] [phone] 02/20/2014, 12:53 PM

## 2014-02-21 NOTE — Anesthesia Postprocedure Evaluation (Signed)
  Anesthesia Post-op Note  Patient: Jennifer Bolton  Procedure(s) Performed: Procedure(s): LAPAROSCOPIC CHOLECYSTECTOMY WITH INTRAOPERATIVE CHOLANGIOGRAM (N/A) BILATERAL TONSILLECTOMY (Bilateral)  Patient Location: PACU  Anesthesia Type:General  Level of Consciousness: awake  Airway and Oxygen Therapy: Patient Spontanous Breathing  Post-op Pain: mild  Post-op Assessment: Post-op Vital signs reviewed  Post-op Vital Signs: Reviewed  Last Vitals:  Filed Vitals:   02/15/14 0700  BP: 103/66  Pulse: 78  Temp: 36.4 C  Resp: 18    Complications: No apparent anesthesia complications

## 2014-03-05 ENCOUNTER — Encounter (INDEPENDENT_AMBULATORY_CARE_PROVIDER_SITE_OTHER): Payer: 59 | Admitting: General Surgery

## 2014-09-30 ENCOUNTER — Encounter (HOSPITAL_BASED_OUTPATIENT_CLINIC_OR_DEPARTMENT_OTHER): Payer: Self-pay

## 2014-09-30 ENCOUNTER — Emergency Department (HOSPITAL_BASED_OUTPATIENT_CLINIC_OR_DEPARTMENT_OTHER)
Admission: EM | Admit: 2014-09-30 | Discharge: 2014-09-30 | Discharge status: Against Medical Advice | Payer: BLUE CROSS/BLUE SHIELD | Attending: Emergency Medicine | Admitting: Emergency Medicine

## 2014-09-30 DIAGNOSIS — R1084 Generalized abdominal pain: Secondary | ICD-10-CM | POA: Diagnosis present

## 2014-09-30 DIAGNOSIS — Z3202 Encounter for pregnancy test, result negative: Secondary | ICD-10-CM | POA: Insufficient documentation

## 2014-09-30 LAB — URINALYSIS, ROUTINE W REFLEX MICROSCOPIC
Glucose, UA: NEGATIVE mg/dL
HGB URINE DIPSTICK: NEGATIVE
Ketones, ur: NEGATIVE mg/dL
Nitrite: NEGATIVE
PROTEIN: NEGATIVE mg/dL
Specific Gravity, Urine: 1.028 (ref 1.005–1.030)
UROBILINOGEN UA: 2 mg/dL — AB (ref 0.0–1.0)
pH: 5.5 (ref 5.0–8.0)

## 2014-09-30 LAB — URINE MICROSCOPIC-ADD ON

## 2014-09-30 LAB — PREGNANCY, URINE: PREG TEST UR: NEGATIVE

## 2014-09-30 NOTE — ED Notes (Signed)
Pt called x2 with no answer in waiting area

## 2014-09-30 NOTE — ED Notes (Signed)
Diffuse upper abdominal pain since 1630 today. N/v. Chills

## 2014-09-30 NOTE — ED Notes (Addendum)
Mid upper abdominal pain (epigastric pain).  Pt also reports sob with this, pt was helped to the bathroom to void, EKG ordered

## 2016-03-14 IMAGING — US US ABDOMEN COMPLETE
1 series · 14 of 25 positions shown · non-contrast
Comparison: None.

CLINICAL DATA: Two-day history of epigastric pain

EXAM:
ULTRASOUND ABDOMEN COMPLETE

[Series 1: us abdomen complete · 0.43mm/px · 14 of 68 slices shown]
[im 1/68]
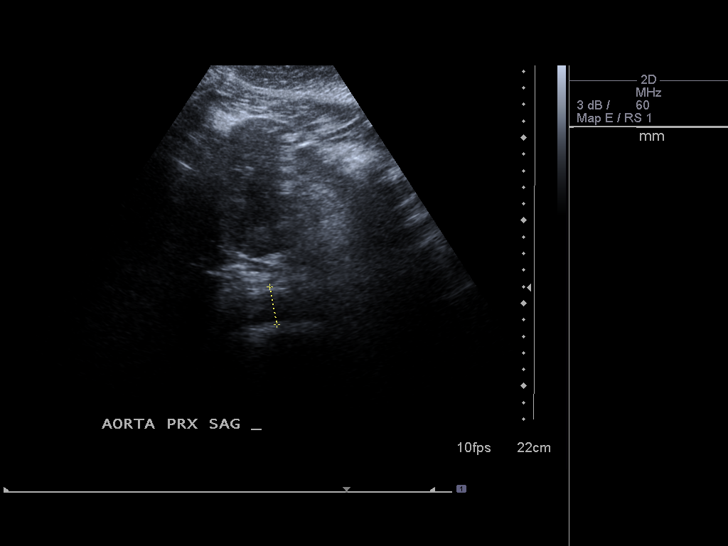
[im 6/68]
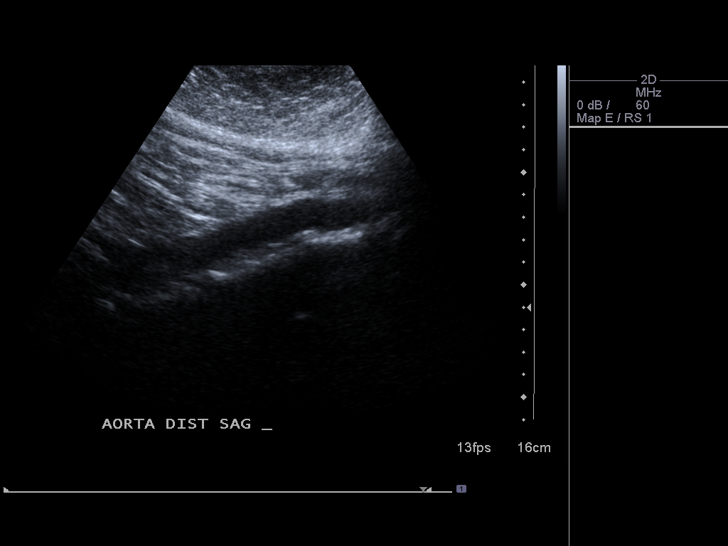
[im 12/68]
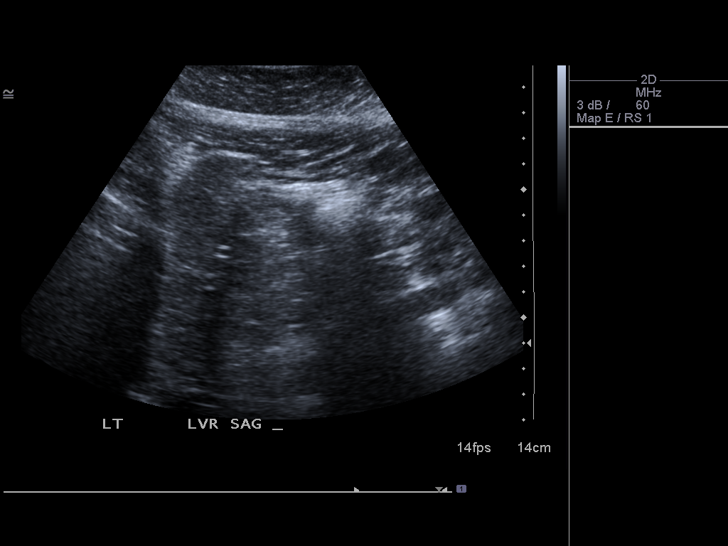
[im 17/68]
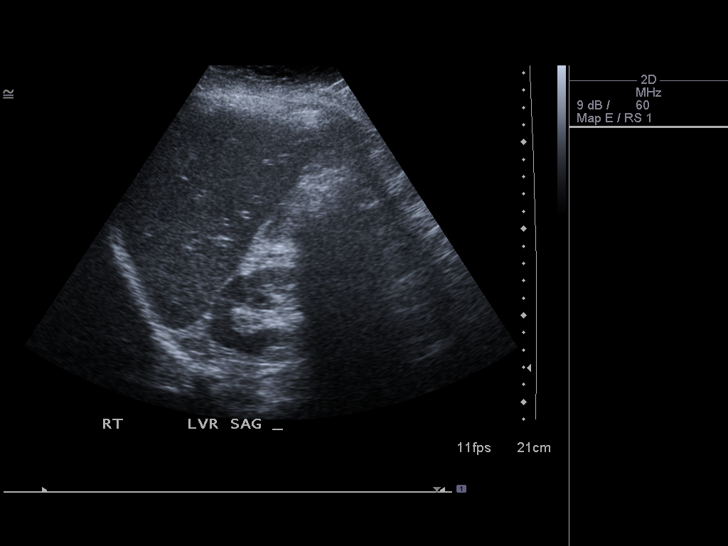
[im 23/68]
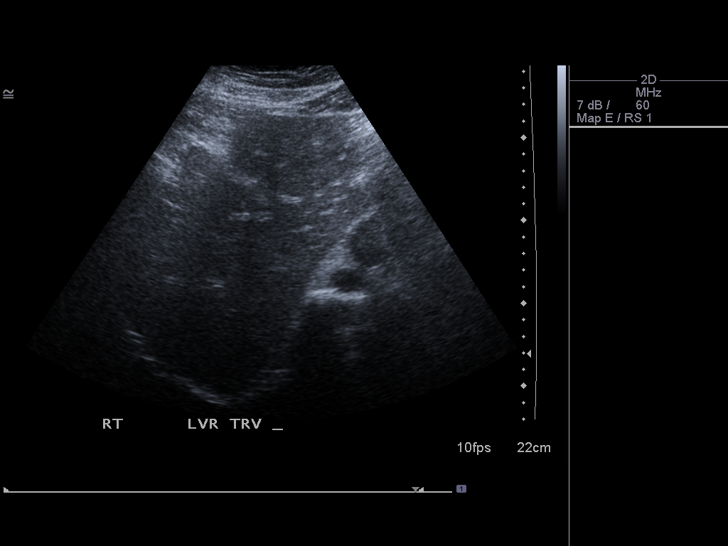
[im 26/68]
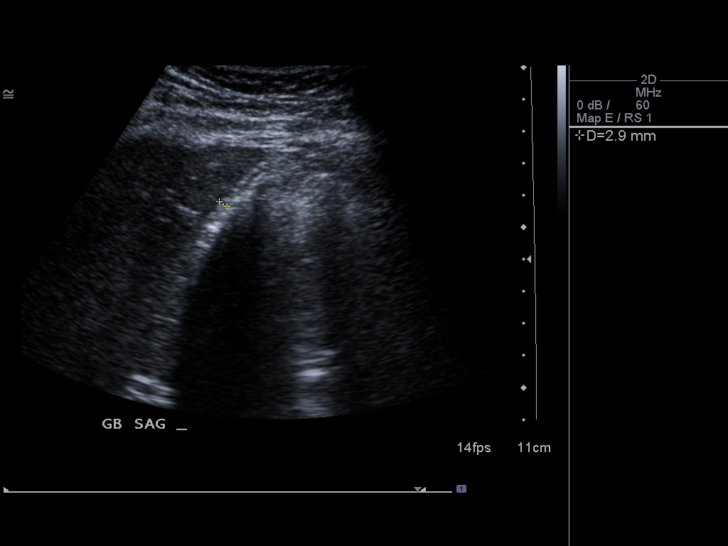
[im 31/68]
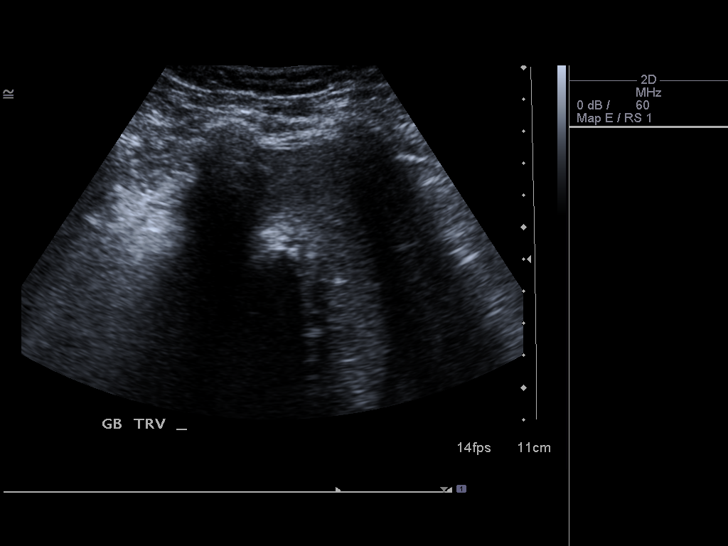
[im 37/68]
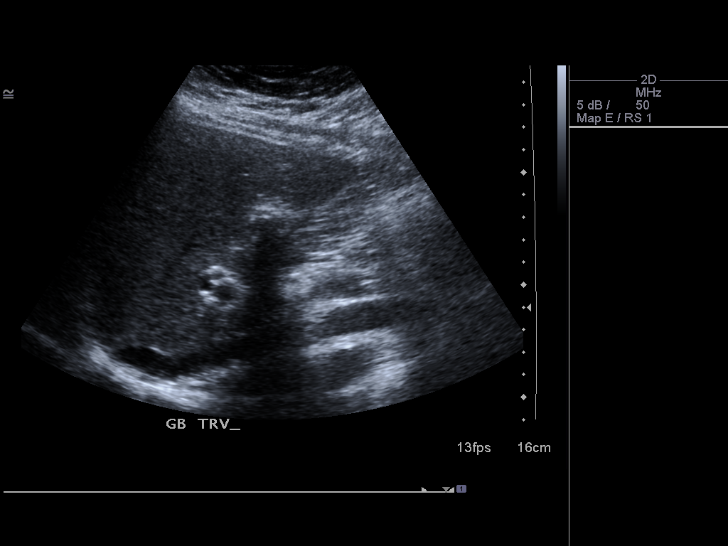
[im 42/68]
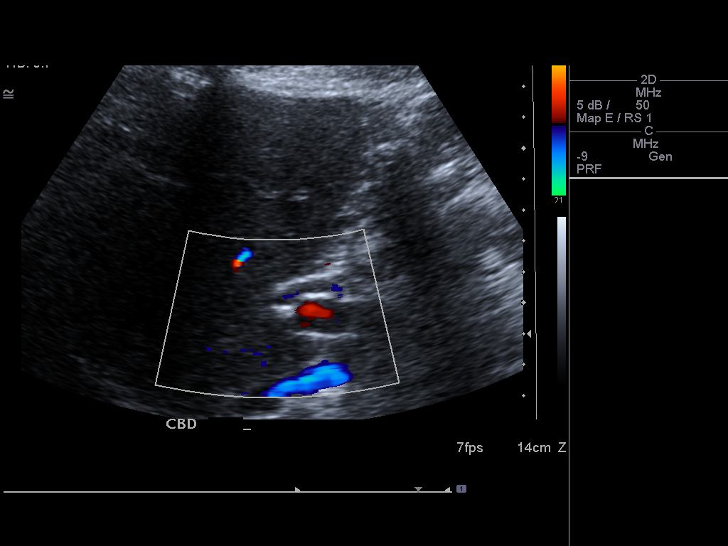
[im 45/68]
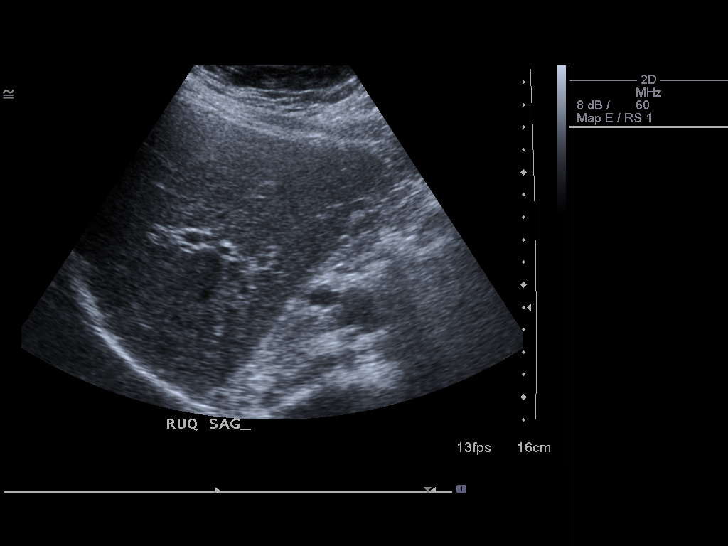
[im 51/68]
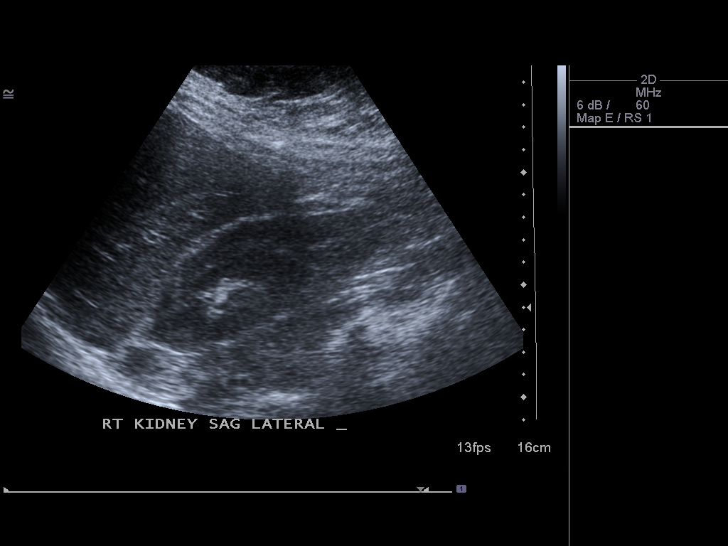
[im 56/68]
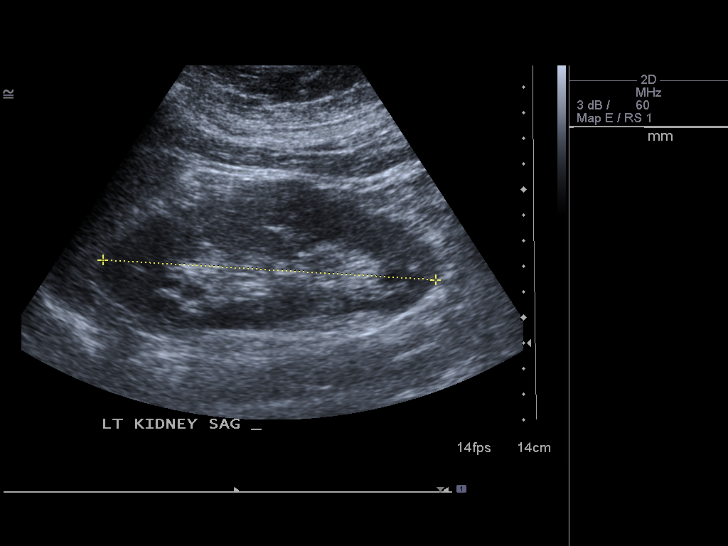
[im 62/68]
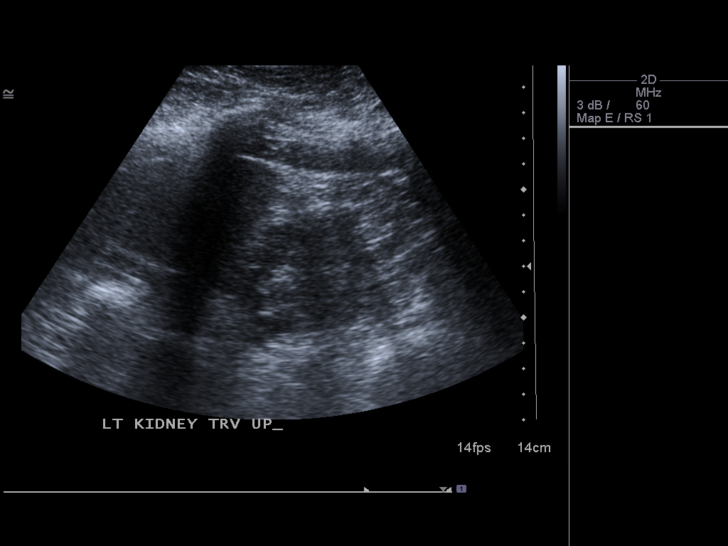
[im 68/68]
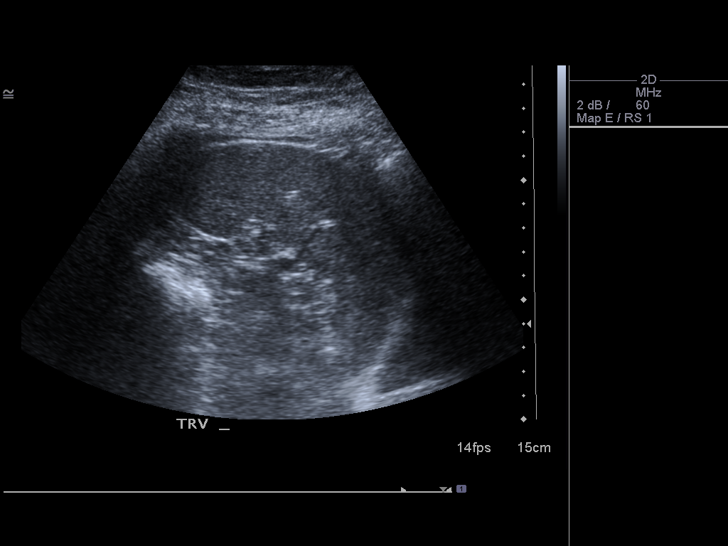

[14 of 25 positions shown; findings below may reference images not displayed]

FINDINGS: Gallbladder:

The gallbladder exhibits a wall echo shadow contour consistent with
multiple shadowing stones. The gallbladder wall is measured at 3 mm.
There is no positive sonographic Murphy's sign.

Common bile duct:

Diameter: 4 mm

Liver:

No focal lesion identified. Within normal limits in parenchymal
echogenicity; bowel gas limits evaluation.

IVC:

No abnormality visualized; bowel gas limits evaluation.

Pancreas:

Visualized portion unremarkable; bowel gas limits evaluation.

Spleen:

Size and appearance within normal limits.

Right Kidney:

Length: 12.2 cm. Echogenicity within normal limits. No mass or
hydronephrosis visualized.

Left Kidney:

Length: 13.1 cm. Echogenicity within normal limits. No mass or
hydronephrosis visualized.

Abdominal aorta:

No aneurysm visualized; evaluation limited by bowel gas.

Other findings:

No ascites is demonstrated.
IMPRESSION: 1. There are multiple gallstones without sonographic evidence of
acute cholecystitis.
2. The remainder of the visualized abdominal visceral are within the
limits of normal.
